# Patient Record
Sex: Male | Born: 1955 | Race: Black or African American | Hispanic: No | Marital: Married | Smoking: Former smoker
Health system: Southern US, Community
[De-identification: ages and names within clinical notes are randomized; demographics above are authoritative.]

## PROBLEM LIST (undated history)

## (undated) DIAGNOSIS — K5792 Diverticulitis of intestine, part unspecified, without perforation or abscess without bleeding: Secondary | ICD-10-CM

## (undated) DIAGNOSIS — I10 Essential (primary) hypertension: Secondary | ICD-10-CM

## (undated) HISTORY — DX: Diverticulitis of intestine, part unspecified, without perforation or abscess without bleeding: K57.92

## (undated) HISTORY — DX: Essential (primary) hypertension: I10

---

## 2008-03-10 LAB — HM COLONOSCOPY

## 2008-09-07 ENCOUNTER — Encounter: Admission: RE | Admit: 2008-09-07 | Discharge: 2008-09-07 | Payer: Self-pay | Admitting: Emergency Medicine

## 2011-05-08 ENCOUNTER — Encounter (INDEPENDENT_AMBULATORY_CARE_PROVIDER_SITE_OTHER): Payer: 59

## 2011-05-08 DIAGNOSIS — Z0000 Encounter for general adult medical examination without abnormal findings: Secondary | ICD-10-CM

## 2011-05-08 DIAGNOSIS — N401 Enlarged prostate with lower urinary tract symptoms: Secondary | ICD-10-CM

## 2011-05-08 DIAGNOSIS — I10 Essential (primary) hypertension: Secondary | ICD-10-CM

## 2012-03-23 ENCOUNTER — Ambulatory Visit (INDEPENDENT_AMBULATORY_CARE_PROVIDER_SITE_OTHER): Payer: 59 | Admitting: Internal Medicine

## 2012-03-23 ENCOUNTER — Telehealth: Payer: Self-pay | Admitting: *Deleted

## 2012-03-23 VITALS — BP 130/88 | HR 82 | Temp 98.3°F | Resp 20 | Ht 70.0 in | Wt 226.8 lb

## 2012-03-23 DIAGNOSIS — Z8619 Personal history of other infectious and parasitic diseases: Secondary | ICD-10-CM

## 2012-03-23 DIAGNOSIS — R109 Unspecified abdominal pain: Secondary | ICD-10-CM

## 2012-03-23 DIAGNOSIS — N400 Enlarged prostate without lower urinary tract symptoms: Secondary | ICD-10-CM

## 2012-03-23 DIAGNOSIS — I10 Essential (primary) hypertension: Secondary | ICD-10-CM | POA: Insufficient documentation

## 2012-03-23 DIAGNOSIS — Z7189 Other specified counseling: Secondary | ICD-10-CM

## 2012-03-23 DIAGNOSIS — Z79899 Other long term (current) drug therapy: Secondary | ICD-10-CM

## 2012-03-23 LAB — POCT URINALYSIS DIPSTICK
Blood, UA: NEGATIVE
Nitrite, UA: NEGATIVE
Protein, UA: NEGATIVE
Spec Grav, UA: 1.015
Urobilinogen, UA: 0.2
pH, UA: 6.5

## 2012-03-23 LAB — POCT CBC
Hemoglobin: 15.5 g/dL (ref 14.1–18.1)
Lymph, poc: 2.6 (ref 0.6–3.4)
MCH, POC: 28 pg (ref 27–31.2)
MCHC: 30.6 g/dL — AB (ref 31.8–35.4)
MCV: 91.6 fL (ref 80–97)
POC MID %: 7.2 %M (ref 0–12)
RBC: 5.54 M/uL (ref 4.69–6.13)
WBC: 5.5 10*3/uL (ref 4.6–10.2)

## 2012-03-23 LAB — POCT UA - MICROSCOPIC ONLY
Bacteria, U Microscopic: NEGATIVE
Casts, Ur, LPF, POC: NEGATIVE
Crystals, Ur, HPF, POC: NEGATIVE
Epithelial cells, urine per micros: NEGATIVE
Mucus, UA: NEGATIVE
Yeast, UA: NEGATIVE

## 2012-03-23 MED ORDER — VALSARTAN 160 MG PO TABS: 160.0000 mg | ORAL_TABLET | Freq: Every day | ORAL | Status: DC

## 2012-03-23 MED ORDER — AMLODIPINE BESYLATE 10 MG PO TABS: 10.0000 mg | ORAL_TABLET | Freq: Every day | ORAL | Status: DC

## 2012-03-23 MED ORDER — FINASTERIDE 1 MG PO TABS: 5.0000 mg | ORAL_TABLET | Freq: Every day | ORAL | Status: DC

## 2012-03-23 NOTE — Patient Instructions (Addendum)
DASH Diet The DASH diet stands for "Dietary Approaches to Stop Hypertension." It is a healthy eating plan that has been shown to reduce high blood pressure (hypertension) in as little as 14 days, while also possibly providing other significant health benefits. These other health benefits include reducing the risk of breast cancer after menopause and reducing the risk of type 2 diabetes, heart disease, colon cancer, and stroke. Health benefits also include weight loss and slowing kidney failure in patients with chronic kidney disease.  DIET GUIDELINES  Limit salt (sodium). Your diet should contain less than 1500 mg of sodium daily.  Limit refined or processed carbohydrates. Your diet should include mostly whole grains. Desserts and added sugars should be used sparingly.  Include small amounts of heart-healthy fats. These types of fats include nuts, oils, and tub margarine. Limit saturated and trans fats. These fats have been shown to be harmful in the body. CHOOSING FOODS  The following food groups are based on a 2000 calorie diet. See your Registered Dietitian for individual calorie needs. Grains and Grain Products (6 to 8 servings daily)  Eat More Often: Whole-wheat bread, brown rice, whole-grain or wheat pasta, quinoa, popcorn without added fat or salt (air popped).  Eat Less Often: White bread, white pasta, white rice, cornbread. Vegetables (4 to 5 servings daily)  Eat More Often: Fresh, frozen, and canned vegetables. Vegetables may be raw, steamed, roasted, or grilled with a minimal amount of fat.  Eat Less Often/Avoid: Creamed or fried vegetables. Vegetables in a cheese sauce. Fruit (4 to 5 servings daily)  Eat More Often: All fresh, canned (in natural juice), or frozen fruits. Dried fruits without added sugar. One hundred percent fruit juice ( cup [237 mL] daily).  Eat Less Often: Dried fruits with added sugar. Canned fruit in light or heavy syrup. [employer], Fish, and Poultry (2  servings or less daily. One serving is 3 to 4 oz [85-114 g]).  Eat More Often: Ninety percent or leaner ground beef, tenderloin, sirloin. Round cuts of beef, chicken breast, [place] breast. All fish. Grill, bake, or broil your meat. Nothing should be fried.  Eat Less Often/Avoid: Fatty cuts of meat, [place], or chicken leg, thigh, or wing. Fried cuts of meat or fish. Dairy (2 to 3 servings)  Eat More Often: Low-fat or fat-free milk, low-fat plain or light yogurt, reduced-fat or part-skim cheese.  Eat Less Often/Avoid: Milk (whole, 2%).Whole milk yogurt. Full-fat cheeses. Nuts, Seeds, and Legumes (4 to 5 servings per week)  Eat More Often: All without added salt.  Eat Less Often/Avoid: Salted nuts and seeds, canned beans with added salt. Fats and Sweets (limited)  Eat More Often: Vegetable oils, tub margarines without trans fats, sugar-free gelatin. Mayonnaise and salad dressings.  Eat Less Often/Avoid: Coconut oils, palm oils, butter, stick margarine, cream, half and half, cookies, candy, pie. FOR MORE INFORMATION The Dash Diet Eating Plan: www.dashdiet.org Document Released: 04/24/2011 Document Revised: 07/28/2011 Document Reviewed: 04/24/2011 [facility] Patient Information 2013 [place], [place]. Hepatitis C Hepatitis C is a viral infection of the liver. Infection may go undetected for months or years because symptoms may be absent or very mild. Chronic liver disease is the main danger of hepatitis C. This may lead to scarring of the liver (cirrhosis), liver failure, and liver cancer. CAUSES  Hepatitis C is caused by the hepatitis C virus (HCV). Formerly, hepatitis C infections were most commonly transmitted through blood transfusions. In the early 1990s, routine testing of donated blood for hepatitis C and exclusion  of blood that tests positive for HCV began. Now, HCV is most commonly transmitted from person to person through injection drug use, sharing needles, or sex with an infected  person. A caregiver may also get the infection from exposure to the blood of an infected patient by way of a cut or needle stick.  SYMPTOMS  Acute Phase Many cases of acute HCV infection are mild and cause few problems.Some people may not even realize they are sick.Symptoms in others may last a few weeks to several months and include:  Feeling very tired.  Loss of appetite.  Nausea.  Vomiting.  Abdominal pain.  Dark yellow urine.  Yellow skin and eyes (jaundice).  Itching of the skin. Chronic Phase  Between 50% to 85% of people who get HCV infection become "chronic carriers." They often have no symptoms, but the virus stays in their body.They may spread the virus to others and can get long-term liver disease.  Many people with chronic HCV infection remain healthy for many years. However, up to 1 in 5 chronically infected people may develop severe liver diseases including scarring of the liver (cirrhosis), liver failure, or liver cancer. DIAGNOSIS  Diagnosis of hepatitis C infection is made by testing blood for the presence of hepatitis C viral particles called RNA. Other tests may also be done to measure the status of current liver function, exclude other liver problems, or assess liver damage. TREATMENT  Treatment with many antiviral drugs is available and recommended for some patients with chronic HCV infection. Drug treatment is generally considered appropriate for patients who:  Are 40 years of age or older.  Have a positive test for HCV particles in the blood.  Have a liver tissue sample (biopsy) that shows chronic hepatitis and significant scarring (fibrosis).  Do not have signs of liver failure.  Have acceptable blood test results that confirm the wellness of other body organs.  Are willing to be treated and conform to treatment requirements.  Have no other circumstances that would prevent treatment from being recommended (contraindications). All people who are  offered and choose to receive drug treatment must understand that careful medical follow up for many months and even years is crucial in order to make successful care possible. The goal of drug treatment is to eliminate any evidence of HCV in the blood on a long-term basis. This is called a "sustained virologic response" or SVR. Achieving a SVR is associated with a decrease in the chance of life-threatening liver problems, need for a liver transplant, liver cancer rates, and liver-related complications. Successful treatment currently requires taking treatment drugs for at least 24 weeks and up to 72 weeks. An injected drug (interferon) given weekly and an oral antiviral medicine taken daily are usually prescribed. Side effects from these drugs are common and some may be very serious. Your response to treatment must be carefully monitored by both you and your caregiver throughout the entire treatment period. PREVENTION There is no vaccine for hepatitis C. The only way to prevent the disease is to reduce the risk of exposure to the virus.   Avoid sharing drug needles or personal items like toothbrushes, razors, and nail clippers with an infected person.  Healthcare workers need to avoid injuries and wear appropriate protective equipment such as gloves, gowns, and face masks when performing invasive medical or nursing procedures. HOME CARE INSTRUCTIONS  To avoid making your liver disease worse:  Strictly avoid drinking alcohol.  Carefully review all new prescriptions of medicines with your caregiver.  Ask your caregiver which drugs you should avoid. The following drugs are toxic to the liver, and your caregiver may tell you to avoid them:  Isoniazid.  Methyldopa.  Acetaminophen.  Anabolic steroids (muscle-building drugs).  Erythromycin.  Oral contraceptives (birth control pills).  Check with your caregiver to make sure medicine you are currently taking will not be harmful.  Periodic blood  tests may be required. Follow your caregiver's advice about when you should have blood tests.  Avoid a sexual relationship until advised otherwise by your caregiver.  Avoid activities that could expose other people to your blood. Examples include sharing a toothbrush, nail clippers, razors, and needles.  Bed rest is not necessary, but it may make you feel better. Recovery time is not related to the amount of rest you receive.  This infection is contagious. Follow your caregiver's instructions in order to avoid spread of the infection. SEEK IMMEDIATE MEDICAL CARE IF:  You have increasing fatigue or weakness.  You have an oral temperature above 102 F (38.9 C), not controlled by medicine.  You develop loss of appetite, nausea, or vomiting.  You develop jaundice.  You develop easy bruising or bleeding.  You develop any severe problems as a result of your treatment. MAKE SURE YOU:   Understand these instructions.  Will watch your condition.  Will get help right away if you are not doing well or get worse. Document Released: 05/02/2000 Document Revised: 07/28/2011 Document Reviewed: 09/04/2010 [facility] Patient Information 2013 [place], [place].

## 2012-03-23 NOTE — Progress Notes (Signed)
  Subjective:    Patient ID: Phillip Hicks, male    DOB: 07-19-1955, 56 y.o.   MRN: [id number]  HPI HTN controlled and needs rfs. BPH is mild Has slowly progressive left flank and left abdomen pain, waxes and wanes, occ severe. Spasms No blood seen, no fever, urinary, gi sxs. Past hx of hepatitis c being ruled out.  Review of Systems     Objective:   Physical Exam  Vitals reviewed. Constitutional: He is oriented to person, place, and time. He appears well-developed and well-nourished.  HENT:  Right Ear: External ear normal.  Left Ear: External ear normal.  Nose: Nose normal.  Eyes: EOM are normal.  Cardiovascular: Normal rate, regular rhythm and normal heart sounds.   Pulmonary/Chest: Effort normal and breath sounds normal.  Abdominal: Soft. Bowel sounds are normal. He exhibits distension. He exhibits no mass. There is tenderness. There is no rebound and no guarding.  Neurological: He is alert and oriented to person, place, and time. Coordination normal.  Skin: Skin is warm and dry.  Psychiatric: He has a normal mood and affect. His behavior is normal. Judgment and thought content normal.     Results for orders placed in visit on 03/23/12  POCT CBC      Component Value Range   WBC 5.5  4.6 - 10.2 K/uL   Lymph, poc 2.6  0.6 - 3.4   POC LYMPH PERCENT 46.7  10 - 50 %L   MID (cbc) 0.4  0 - 0.9   POC MID % 7.2  0 - 12 %M   POC Granulocyte 2.5  2 - 6.9   Granulocyte percent 46.1  37 - 80 %G   RBC 5.54  4.69 - 6.13 M/uL   Hemoglobin 15.5  14.1 - 18.1 g/dL   HCT, POC 14.7  82.9 - 53.7 %   MCV 91.6  80 - 97 fL   MCH, POC 28.0  27 - 31.2 pg   MCHC 30.6 (*) 31.8 - 35.4 g/dL   RDW, POC 56.2     Platelet Count, POC 404  142 - 424 K/uL   MPV 7.4  0 - 99.8 fL  POCT UA - MICROSCOPIC ONLY      Component Value Range   WBC, Ur, HPF, POC neg     RBC, urine, microscopic neg     Bacteria, U Microscopic neg     Mucus, UA neg     Epithelial cells, urine per micros neg     Crystals, Ur,  HPF, POC neg     Casts, Ur, LPF, POC neg     Yeast, UA neg    POCT URINALYSIS DIPSTICK      Component Value Range   Color, UA yellow     Clarity, UA clear     Glucose, UA neg     Bilirubin, UA neg     Ketones, UA neg     Spec Grav, UA 1.015     Blood, UA neg     pH, UA 6.5     Protein, UA neg     Urobilinogen, UA 0.2     Nitrite, UA neg     Leukocytes, UA Negative          Assessment & Plan:  RF meds 1 year Sched CPE in next 2 mos CT abd/pelvis for chronic left flank and abd pain

## 2012-03-24 LAB — COMPREHENSIVE METABOLIC PANEL
ALT: 24 U/L (ref 0–53)
AST: 20 U/L (ref 0–37)
Albumin: 4.7 g/dL (ref 3.5–5.2)
Alkaline Phosphatase: 55 U/L (ref 39–117)
BUN: 10 mg/dL (ref 6–23)
Potassium: 4 mEq/L (ref 3.5–5.3)
Sodium: 140 mEq/L (ref 135–145)

## 2012-03-24 LAB — PSA: PSA: 0.16 ng/mL (ref <=4.00)

## 2012-03-24 LAB — LIPID PANEL
LDL Cholesterol: 78 mg/dL (ref 0–99)
Triglycerides: 68 mg/dL (ref <150)
VLDL: 14 mg/dL (ref 0–40)

## 2012-03-25 ENCOUNTER — Telehealth: Payer: Self-pay

## 2012-03-25 DIAGNOSIS — R109 Unspecified abdominal pain: Secondary | ICD-10-CM

## 2012-03-25 DIAGNOSIS — N400 Enlarged prostate without lower urinary tract symptoms: Secondary | ICD-10-CM

## 2012-03-25 DIAGNOSIS — Z8619 Personal history of other infectious and parasitic diseases: Secondary | ICD-10-CM

## 2012-03-25 DIAGNOSIS — Z7189 Other specified counseling: Secondary | ICD-10-CM

## 2012-03-25 DIAGNOSIS — I10 Essential (primary) hypertension: Secondary | ICD-10-CM

## 2012-03-25 MED ORDER — FINASTERIDE 5 MG PO TABS: 5.0000 mg | ORAL_TABLET | Freq: Every day | ORAL | Status: DC

## 2012-03-25 NOTE — Telephone Encounter (Signed)
The Rx was sent through as 1mg  tablet, take 5 tabs daily, wanted to change to 5mg  tablets, one daily. Gave authorization for this, dosage same.

## 2012-03-25 NOTE — Telephone Encounter (Signed)
Phillip Hicks from [facility] [employer] [facility], calling to request rx auth on finasteride please contact Phillip Hicks @ [phone] ref# [id number]

## 2012-03-25 NOTE — Telephone Encounter (Signed)
Called left message for call back

## 2012-03-31 ENCOUNTER — Ambulatory Visit
Admission: RE | Admit: 2012-03-31 | Discharge: 2012-03-31 | Disposition: A | Discharge status: Home / Self Care | Payer: 59 | Source: Ambulatory Visit | Attending: Internal Medicine | Admitting: Internal Medicine

## 2012-03-31 DIAGNOSIS — R109 Unspecified abdominal pain: Secondary | ICD-10-CM

## 2012-03-31 MED ORDER — IOHEXOL 300 MG/ML  SOLN
100.0000 mL | Freq: Once | INTRAMUSCULAR | Status: AC | PRN
  Administered 2012-03-31: 100 mL via INTRAVENOUS

## 2012-04-05 ENCOUNTER — Telehealth: Payer: Self-pay | Admitting: *Deleted

## 2012-04-05 NOTE — Telephone Encounter (Signed)
Called patient at 80, I advised him to come to [facility] walk-in clinic 04/06/12 between 0900-1500 to see Dr Perrin Maltese to discuss imaging results.

## 2012-04-06 ENCOUNTER — Ambulatory Visit (INDEPENDENT_AMBULATORY_CARE_PROVIDER_SITE_OTHER): Payer: 59 | Admitting: Internal Medicine

## 2012-04-06 VITALS — BP 118/72 | HR 74 | Temp 97.9°F | Resp 16 | Ht 74.0 in | Wt 227.0 lb

## 2012-04-06 DIAGNOSIS — K5732 Diverticulitis of large intestine without perforation or abscess without bleeding: Secondary | ICD-10-CM

## 2012-04-06 DIAGNOSIS — K5792 Diverticulitis of intestine, part unspecified, without perforation or abscess without bleeding: Secondary | ICD-10-CM

## 2012-04-06 MED ORDER — AMOXICILLIN-POT CLAVULANATE 875-125 MG PO TABS: 1.0000 | ORAL_TABLET | Freq: Two times a day (BID) | ORAL | Status: DC

## 2012-04-06 NOTE — Progress Notes (Signed)
  Subjective:    Patient ID: Phillip Hicks, male    DOB: [birth year], 56 y.o.   MRN: [id number]  HPI Pain 90% resolved, had mild diverticulitis on ct abd. Much better. NO anorexia, fever, pain with movement   Review of Systems     Objective:   Physical Exam  Vitals reviewed. Constitutional: He is oriented to person, place, and time. He appears well-developed and well-nourished.  Cardiovascular: Normal rate.   Pulmonary/Chest: Effort normal and breath sounds normal.  Abdominal: Soft. Bowel sounds are normal. He exhibits no mass. There is no tenderness.  Neurological: He is alert and oriented to person, place, and time.  Psychiatric: He has a normal mood and affect.          Assessment & Plan:  Augmentin 875 bid

## 2012-04-06 NOTE — Patient Instructions (Signed)
Diverticulitis °A diverticulum is a small pouch or sac on the colon. Diverticulosis is the presence of these diverticula on the colon. Diverticulitis is the irritation (inflammation) or infection of diverticula. °CAUSES  °The colon and its diverticula contain bacteria. If food particles block the tiny opening to a diverticulum, the bacteria inside can grow and cause an increase in pressure. This leads to infection and inflammation and is called diverticulitis. °SYMPTOMS  °· Abdominal pain and tenderness. Usually, the pain is located on the left side of your abdomen. However, it could be located elsewhere. °· Fever. °· Bloating. °· Feeling sick to your stomach (nausea). °· Throwing up (vomiting). °· Abnormal stools. °DIAGNOSIS  °Your caregiver will take a history and perform a physical exam. Since many things can cause abdominal pain, other tests may be necessary. Tests may include: °· Blood tests. °· Urine tests. °· X-ray of the abdomen. °· CT scan of the abdomen. °Sometimes, surgery is needed to determine if diverticulitis or other conditions are causing your symptoms. °TREATMENT  °Most of the time, you can be treated without surgery. Treatment includes: °· Resting the bowels by only having liquids for a few days. As you improve, you will need to eat a low-fiber diet. °· Intravenous (IV) fluids if you are losing body fluids (dehydrated). °· Antibiotic medicines that treat infections may be given. °· Pain and nausea medicine, if needed. °· Surgery if the inflamed diverticulum has burst. °HOME CARE INSTRUCTIONS  °· Try a clear liquid diet (broth, tea, or water for as long as directed by your caregiver). You may then gradually begin a low-fiber diet as tolerated. A low-fiber diet is a diet with less than 10 grams of fiber. Choose the foods below to reduce fiber in the diet: °· White breads, cereals, rice, and pasta. °· Cooked fruits and vegetables or soft fresh fruits and vegetables without the skin. °· Ground or  well-cooked tender beef, ham, veal, lamb, pork, or poultry. °· Eggs and seafood. °· After your diverticulitis symptoms have improved, your caregiver may put you on a high-fiber diet. A high-fiber diet includes 14 grams of fiber for every 1000 calories consumed. For a standard 2000 calorie diet, you would need 28 grams of fiber. Follow these diet guidelines to help you increase the fiber in your diet. It is important to slowly increase the amount fiber in your diet to avoid gas, constipation, and bloating. °· Choose whole-grain breads, cereals, pasta, and brown rice. °· Choose fresh fruits and vegetables with the skin on. Do not overcook vegetables because the more vegetables are cooked, the more fiber is lost. °· Choose more nuts, seeds, legumes, dried peas, beans, and lentils. °· Look for food products that have greater than 3 grams of fiber per serving on the Nutrition Facts label. °· Take all medicine as directed by your caregiver. °· If your caregiver has given you a follow-up appointment, it is very important that you go. Not going could result in lasting (chronic) or permanent injury, pain, and disability. If there is any problem keeping the appointment, call to reschedule. °SEEK MEDICAL CARE IF:  °· Your pain does not improve. °· You have a hard time advancing your diet beyond clear liquids. °· Your bowel movements do not return to normal. °SEEK IMMEDIATE MEDICAL CARE IF:  °· Your pain becomes worse. °· You have an oral temperature above 102° F (38.9° C), not controlled by medicine. °· You have repeated vomiting. °· You have bloody or black, tarry stools. °· Symptoms   that brought you to your caregiver become worse or are not getting better. °MAKE SURE YOU:  °· Understand these instructions. °· Will watch your condition. °· Will get help right away if you are not doing well or get worse. °Document Released: 02/12/2005 Document Revised: 07/28/2011 Document Reviewed: 06/10/2010 °[employer]® Patient Information  ©2013 [employer]. ° °

## 2012-05-10 ENCOUNTER — Ambulatory Visit (INDEPENDENT_AMBULATORY_CARE_PROVIDER_SITE_OTHER): Payer: 59 | Admitting: Family Medicine

## 2012-05-10 VITALS — BP 124/76 | HR 75 | Temp 98.3°F | Resp 16 | Ht 70.0 in | Wt 229.6 lb

## 2012-05-10 DIAGNOSIS — Z0000 Encounter for general adult medical examination without abnormal findings: Secondary | ICD-10-CM

## 2012-05-10 DIAGNOSIS — K5792 Diverticulitis of intestine, part unspecified, without perforation or abscess without bleeding: Secondary | ICD-10-CM

## 2012-05-10 NOTE — Progress Notes (Signed)
[facility] [address] [place] [address] [phone]  Date:  05/10/2012   Name:  Phillip Hicks   DOB:  09-19-1955   MRN:  [id number]  PCP:  No primary provider on file.    Chief Complaint: Annual Exam   History of Present Illness:  Phillip Hicks is a 56 y.o. very pleasant male patient who presents with the following:  History of diverticulitis last month- he is now better. He takes medication for HTN and BPH.   He was diagnosed with Hep C in the past several years ago. However, it seems this was a false positive and he does not really have Hep C.   He has labs done in November- he is not fasting today as no further labs are needed.   His flu shot is already done.   His last colonoscopy was about 5 years ago.    He thinks he has had a tetanus shot within 10 years, but this was done elsewhere.    Patient Active Problem List  Diagnosis  . HTN (hypertension)  . History of hepatitis C    Past Medical History  Diagnosis Date  . Hypertension   . Diverticulitis     No past surgical history on file.  History  Substance Use Topics  . Smoking status: Former Smoker    Quit date: 03/23/1997  . Smokeless tobacco: Not on file  . Alcohol Use: 2.5 oz/week    5 drink(s) per week    Family History  Problem Relation Age of Onset  . Asthma Mother   . Cancer Mother     No Known Allergies  Medication list has been reviewed and updated.  Current Outpatient Prescriptions on File Prior to Visit  Medication Sig Dispense Refill  . amLODipine (NORVASC) 10 MG tablet Take 1 tablet (10 mg total) by mouth daily.  90 tablet  3  . finasteride (PROSCAR) 5 MG tablet Take 1 tablet (5 mg total) by mouth daily.  90 tablet  2  . valsartan (DIOVAN) 160 MG tablet Take 1 tablet (160 mg total) by mouth daily.  90 tablet  3  . amoxicillin-clavulanate (AUGMENTIN) 875-125 MG per tablet Take 1 tablet by mouth 2 (two) times daily.  20 tablet  0    Review of Systems:  As  per HPI- otherwise negative.   Physical Examination: Filed Vitals:   05/10/12 0858  BP: 124/76  Pulse: 75  Temp: 98.3 F (36.8 C)  Resp: 16   Filed Vitals:   05/10/12 0858  Height: 5\' 10"  (1.778 m)  Weight: 229 lb 9.6 oz (104.146 kg)   Body mass index is 32.94 kg/(m^2). Ideal Body Weight: Weight in (lb) to have BMI = 25: 173.9   GEN: WDWN, NAD, Non-toxic, A & O x 3, overweight HEENT: Atraumatic, Normocephalic. Neck supple. No masses, No LAD. Bilateral TM wnl, oropharynx normal.  PEERL,EOMI.   Ears and Nose: No external deformity. CV: RRR, No M/G/R. No JVD. No thrill. No extra heart sounds. PULM: CTA B, no wheezes, crackles, rhonchi. No retractions. No resp. distress. No accessory muscle use. ABD: S, NT, ND, +BS. No rebound. No HSM. EXTR: No c/c/e NEURO Normal gait.  PSYCH: Normally interactive. Conversant. Not depressed or anxious appearing.  Calm demeanor.  Gu: normal genitals, normal DRE, no prostate masses  Assessment and Plan: 1. Diverticulitis   2. Physical exam, annual    PE for age performed today.  Labs and preventative services UTD.  Will  plan further follow- up pending labs. Per his report he does not actually have Hep C.  He will look into the date of his last tetanus shot.  If he cannot confirm this we can do a Tdap at his next visit    Abbe Amsterdam, MD

## 2012-05-10 NOTE — Patient Instructions (Addendum)
Good to see you today.  Your labs from last month looked fine.    Happy birthday!

## 2013-01-03 ENCOUNTER — Ambulatory Visit: Payer: 59 | Admitting: Family Medicine

## 2013-01-03 ENCOUNTER — Ambulatory Visit: Payer: 59

## 2013-01-03 VITALS — BP 132/88 | HR 76 | Temp 98.1°F | Resp 18 | Ht 69.5 in | Wt 228.6 lb

## 2013-01-03 DIAGNOSIS — R109 Unspecified abdominal pain: Secondary | ICD-10-CM

## 2013-01-03 DIAGNOSIS — M545 Low back pain, unspecified: Secondary | ICD-10-CM

## 2013-01-03 LAB — COMPREHENSIVE METABOLIC PANEL WITH GFR
AST: 22 U/L (ref 0–37)
Alkaline Phosphatase: 54 U/L (ref 39–117)
BUN: 7 mg/dL (ref 6–23)
Creat: 0.74 mg/dL (ref 0.50–1.35)
Glucose, Bld: 92 mg/dL (ref 70–99)
Potassium: 4.1 meq/L (ref 3.5–5.3)
Total Bilirubin: 0.9 mg/dL (ref 0.3–1.2)

## 2013-01-03 LAB — POCT UA - MICROSCOPIC ONLY
Bacteria, U Microscopic: NEGATIVE
Casts, Ur, LPF, POC: NEGATIVE
Crystals, Ur, HPF, POC: NEGATIVE
Epithelial cells, urine per micros: NEGATIVE
Mucus, UA: NEGATIVE
RBC, urine, microscopic: NEGATIVE
WBC, Ur, HPF, POC: NEGATIVE
Yeast, UA: NEGATIVE

## 2013-01-03 LAB — POCT URINALYSIS DIPSTICK
Bilirubin, UA: NEGATIVE
Blood, UA: NEGATIVE
Glucose, UA: NEGATIVE
Ketones, UA: NEGATIVE
Leukocytes, UA: NEGATIVE
Nitrite, UA: NEGATIVE
Protein, UA: NEGATIVE
Spec Grav, UA: 1.015
Urobilinogen, UA: 0.2
pH, UA: 7

## 2013-01-03 LAB — POCT CBC
Granulocyte percent: 45.6 %G (ref 37–80)
HCT, POC: 46.2 % (ref 43.5–53.7)
Hemoglobin: 14.8 g/dL (ref 14.1–18.1)
Lymph, poc: 2.3 (ref 0.6–3.4)
MCH, POC: 29.3 pg (ref 27–31.2)
MCHC: 32 g/dL (ref 31.8–35.4)
MCV: 91.4 fL (ref 80–97)
MID (cbc): 0.4 (ref 0–0.9)
MPV: 7.6 fL (ref 0–99.8)
POC Granulocyte: 2.2 (ref 2–6.9)
POC LYMPH PERCENT: 47.2 % (ref 10–50)
POC MID %: 7.2 %M (ref 0–12)
Platelet Count, POC: 321 10*3/uL (ref 142–424)
RBC: 5.05 M/uL (ref 4.69–6.13)
RDW, POC: 14.4 %
WBC: 4.9 10*3/uL (ref 4.6–10.2)

## 2013-01-03 LAB — COMPREHENSIVE METABOLIC PANEL
ALT: 28 U/L (ref 0–53)
Albumin: 4.6 g/dL (ref 3.5–5.2)
CO2: 25 mEq/L (ref 19–32)
Calcium: 9.6 mg/dL (ref 8.4–10.5)
Chloride: 106 mEq/L (ref 96–112)
Sodium: 139 mEq/L (ref 135–145)
Total Protein: 7 g/dL (ref 6.0–8.3)

## 2013-01-03 MED ORDER — CYCLOBENZAPRINE HCL 5 MG PO TABS: 5.0000 mg | ORAL_TABLET | Freq: Every evening | ORAL | Status: DC | PRN

## 2013-01-03 MED ORDER — MELOXICAM 7.5 MG PO TABS: 7.5000 mg | ORAL_TABLET | Freq: Every day | ORAL | Status: DC

## 2013-01-03 NOTE — Patient Instructions (Signed)
Back Exercises These exercises may help you when beginning to rehabilitate your injury. Your symptoms may resolve with or without further involvement from your physician, physical therapist or athletic trainer. While completing these exercises, remember:   Restoring tissue flexibility helps normal motion to return to the joints. This allows healthier, less painful movement and activity.  An effective stretch should be held for at least 30 seconds.  A stretch should never be painful. You should only feel a gentle lengthening or release in the stretched tissue. STRETCH  Extension, Prone on Elbows   Lie on your stomach on the floor, a bed will be too soft. Place your palms about shoulder width apart and at the height of your head.  Place your elbows under your shoulders. If this is too painful, stack pillows under your chest.  Allow your body to relax so that your hips drop lower and make contact more completely with the floor.  Hold this position for __________ seconds.  Slowly return to lying flat on the floor. Repeat __________ times. Complete this exercise __________ times per day.  RANGE OF MOTION  Extension, Prone Press Ups   Lie on your stomach on the floor, a bed will be too soft. Place your palms about shoulder width apart and at the height of your head.  Keeping your back as relaxed as possible, slowly straighten your elbows while keeping your hips on the floor. You may adjust the placement of your hands to maximize your comfort. As you gain motion, your hands will come more underneath your shoulders.  Hold this position __________ seconds.  Slowly return to lying flat on the floor. Repeat __________ times. Complete this exercise __________ times per day.  RANGE OF MOTION- Quadruped, Neutral Spine   Assume a hands and knees position on a firm surface. Keep your hands under your shoulders and your knees under your hips. You may place padding under your knees for comfort.  Drop  your head and point your tail bone toward the ground below you. This will round out your low back like an angry cat. Hold this position for __________ seconds.  Slowly lift your head and release your tail bone so that your back sags into a large arch, like an old horse.  Hold this position for __________ seconds.  Repeat this until you feel limber in your low back.  Now, find your "sweet spot." This will be the most comfortable position somewhere between the two previous positions. This is your neutral spine. Once you have found this position, tense your stomach muscles to support your low back.  Hold this position for __________ seconds. Repeat __________ times. Complete this exercise __________ times per day.  STRETCH  Flexion, Single Knee to Chest   Lie on a firm bed or floor with both legs extended in front of you.  Keeping one leg in contact with the floor, bring your opposite knee to your chest. Hold your leg in place by either grabbing behind your thigh or at your knee.  Pull until you feel a gentle stretch in your low back. Hold __________ seconds.  Slowly release your grasp and repeat the exercise with the opposite side. Repeat __________ times. Complete this exercise __________ times per day.  STRETCH - Hamstrings, Standing  Stand or sit and extend your right / left leg, placing your foot on a chair or foot stool  Keeping a slight arch in your low back and your hips straight forward.  Lead with your chest and   lean forward at the waist until you feel a gentle stretch in the back of your right / left knee or thigh. (When done correctly, this exercise requires leaning only a small distance.)  Hold this position for __________ seconds. Repeat __________ times. Complete this stretch __________ times per day. STRENGTHENING  Deep Abdominals, Pelvic Tilt   Lie on a firm bed or floor. Keeping your legs in front of you, bend your knees so they are both pointed toward the ceiling and  your feet are flat on the floor.  Tense your lower abdominal muscles to press your low back into the floor. This motion will rotate your pelvis so that your tail bone is scooping upwards rather than pointing at your feet or into the floor.  With a gentle tension and even breathing, hold this position for __________ seconds. Repeat __________ times. Complete this exercise __________ times per day.  STRENGTHENING  Abdominals, Crunches   Lie on a firm bed or floor. Keeping your legs in front of you, bend your knees so they are both pointed toward the ceiling and your feet are flat on the floor. Cross your arms over your chest.  Slightly tip your chin down without bending your neck.  Tense your abdominals and slowly lift your trunk high enough to just clear your shoulder blades. Lifting higher can put excessive stress on the low back and does not further strengthen your abdominal muscles.  Control your return to the starting position. Repeat __________ times. Complete this exercise __________ times per day.  STRENGTHENING  Quadruped, Opposite UE/Charizma Gardiner Lift   Assume a hands and knees position on a firm surface. Keep your hands under your shoulders and your knees under your hips. You may place padding under your knees for comfort.  Find your neutral spine and gently tense your abdominal muscles so that you can maintain this position. Your shoulders and hips should form a rectangle that is parallel with the floor and is not twisted.  Keeping your trunk steady, lift your right hand no higher than your shoulder and then your left leg no higher than your hip. Make sure you are not holding your breath. Hold this position __________ seconds.  Continuing to keep your abdominal muscles tense and your back steady, slowly return to your starting position. Repeat with the opposite arm and leg. Repeat __________ times. Complete this exercise __________ times per day. Document Released: 05/23/2005 Document  Revised: 07/28/2011 Document Reviewed: 08/17/2008 [employer] Patient Information 2014 [employer].  

## 2013-01-03 NOTE — Progress Notes (Signed)
[facility]:  Office Visit  Chief Complaint:  Chief Complaint  Patient presents with  . Back Pain    follow up     HPI: Phillip Hicks is a 57 y.o. male who complains of  3 weeks ago started having left flank pain and small of lower back, real sharp pain initially and will take him down to his knees, then it becomes stiff. The less mobile he is the more stiff he gets.  Denies numbness, tingling or weakness.  Has had msk strain playing raquetball from time to time since last several years.  He denies problems with urination--he has had no urinary changes for him since he does have BPH  He deneis fevers, chills, n/v. He felt warm in his back though.  No blood in urine or stool.  He feels better after a good nights rest.  He states that he does not have numbness or tingling but there is some "throbbing" but not really weakness in bilateral legs. He injured his back playing raquetball 25 years ago He has a history of diverticular disease on CT abd  Past Medical History  Diagnosis Date  . Hypertension   . Diverticulitis    No past surgical history on file. History   Social History  . Marital Status: Married    Spouse Name: N/A    Number of Children: N/A  . Years of Education: N/A   Social History Main Topics  . Smoking status: Former Smoker    Quit date: 03/23/1997  . Smokeless tobacco: None  . Alcohol Use: 2.5 oz/week    5 drink(s) per week  . Drug Use: No  . Sexual Activity: None   Other Topics Concern  . None   Social History Narrative  . None   Family History  Problem Relation Age of Onset  . Asthma Mother   . Cancer Mother   . Seizures Brother    No Known Allergies Prior to Admission medications   Medication Sig Start Date End Date Taking? Authorizing Provider  amLODipine (NORVASC) 10 MG tablet Take 1 tablet (10 mg total) by mouth daily. 03/23/12  Yes Jonita Albee, MD  finasteride (PROSCAR) 5 MG tablet Take 1 tablet (5 mg total) by mouth  daily. 03/25/12  Yes Jonita Albee, MD  valsartan (DIOVAN) 160 MG tablet Take 1 tablet (160 mg total) by mouth daily. 03/23/12  Yes Jonita Albee, MD  amoxicillin-clavulanate (AUGMENTIN) 875-125 MG per tablet Take 1 tablet by mouth 2 (two) times daily. 04/06/12   Jonita Albee, MD     ROS: The patient denies fevers, chills, night sweats, unintentional weight loss, chest pain, palpitations, wheezing, dyspnea on exertion, nausea, vomiting, abdominal pain, dysuria, hematuria, melena, numbness, weakness, or tingling.   All other systems have been reviewed and were otherwise negative with the exception of those mentioned in the HPI and as above.    PHYSICAL EXAM: Filed Vitals:   01/03/13 0943  BP: 132/88  Pulse: 76  Temp: 98.1 F (36.7 C)  Resp: 18   Filed Vitals:   01/03/13 0943  Height: 5' 9.5" (1.765 m)  Weight: 228 lb 9.6 oz (103.692 kg)   Body mass index is 33.29 kg/(m^2).  General: Alert, no acute distress HEENT:  Normocephalic, atraumatic, oropharynx patent. EOMI, PERRLA Cardiovascular:  Regular rate and rhythm, no rubs murmurs or gallops.  No Carotid bruits, radial pulse intact. No pedal edema.  Respiratory: Clear to auscultation bilaterally.  No wheezes, rales, or  rhonchi.  No cyanosis, no use of accessory musculature GI: No organomegaly, abdomen is soft and non-tender, positive bowel sounds.  No masses. Skin: No rashes. Neurologic: Facial musculature symmetric. Psychiatric: Patient is appropriate throughout our interaction. Lymphatic: No cervical lymphadenopathy Musculoskeletal: Gait intact. Decrease ROM in flexion, extension, lateral rotation Paras msk tenderness on left more than right at low L spine Nontender at SI jt No saddle anesthesia 5/5 strength, 2/2 DTRs No loss of sensation   LABS: Results for orders placed in visit on 01/03/13  POCT CBC      Result Value Range   WBC 4.9  4.6 - 10.2 K/uL   Lymph, poc 2.3  0.6 - 3.4   POC LYMPH PERCENT 47.2  10 - 50 %L    MID (cbc) 0.4  0 - 0.9   POC MID % 7.2  0 - 12 %M   POC Granulocyte 2.2  2 - 6.9   Granulocyte percent 45.6  37 - 80 %G   RBC 5.05  4.69 - 6.13 M/uL   Hemoglobin 14.8  14.1 - 18.1 g/dL   HCT, POC 16.1  09.6 - 53.7 %   MCV 91.4  80 - 97 fL   MCH, POC 29.3  27 - 31.2 pg   MCHC 32.0  31.8 - 35.4 g/dL   RDW, POC 04.5     Platelet Count, POC 321  142 - 424 K/uL   MPV 7.6  0 - 99.8 fL  POCT UA - MICROSCOPIC ONLY      Result Value Range   WBC, Ur, HPF, POC neg     RBC, urine, microscopic neg     Bacteria, U Microscopic neg     Mucus, UA neg     Epithelial cells, urine per micros neg     Crystals, Ur, HPF, POC neg     Casts, Ur, LPF, POC neg     Yeast, UA neg    POCT URINALYSIS DIPSTICK      Result Value Range   Color, UA yellow     Clarity, UA clear     Glucose, UA neg     Bilirubin, UA neg     Ketones, UA neg     Spec Grav, UA 1.015     Blood, UA neg     pH, UA 7.0     Protein, UA neg     Urobilinogen, UA 0.2     Nitrite, UA neg     Leukocytes, UA Negative       EKG/XRAY:   Primary read interpreted by Dr. Conley Rolls at [facility]. 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  . Back pain, lumbosacral Yes  . Flank pain   . Abdominal  pain, other specified site     Most likely related to DJD of L5-S1 without any neuro deficits Will try trial of mobic and flexeril If he needs ortho referral for possible ESI if sxs worsen and nothing else works ie ROM exercises, PT F/u prn Gross sideeffects, risk and benefits, and alternatives of medications d/w patient. Patient is aware that all medications have potential sideeffects and we are unable to predict every sideeffect or drug-drug interaction that may occur.  Alazne Quant PHUONG, DO 01/03/2013 11:24 AM

## 2013-02-03 ENCOUNTER — Encounter: Payer: Self-pay | Admitting: Family Medicine

## 2013-03-30 ENCOUNTER — Other Ambulatory Visit: Payer: Self-pay | Admitting: Internal Medicine

## 2013-05-04 ENCOUNTER — Encounter: Payer: Self-pay | Admitting: Family Medicine

## 2013-05-04 ENCOUNTER — Ambulatory Visit (INDEPENDENT_AMBULATORY_CARE_PROVIDER_SITE_OTHER): Payer: 59 | Admitting: Family Medicine

## 2013-05-04 VITALS — BP 122/82 | HR 82 | Temp 97.7°F | Resp 16 | Ht 69.5 in | Wt 221.8 lb

## 2013-05-04 DIAGNOSIS — R109 Unspecified abdominal pain: Secondary | ICD-10-CM

## 2013-05-04 DIAGNOSIS — N400 Enlarged prostate without lower urinary tract symptoms: Secondary | ICD-10-CM

## 2013-05-04 DIAGNOSIS — Z7189 Other specified counseling: Secondary | ICD-10-CM

## 2013-05-04 DIAGNOSIS — Z8619 Personal history of other infectious and parasitic diseases: Secondary | ICD-10-CM

## 2013-05-04 DIAGNOSIS — I10 Essential (primary) hypertension: Secondary | ICD-10-CM

## 2013-05-04 DIAGNOSIS — Z23 Encounter for immunization: Secondary | ICD-10-CM

## 2013-05-04 DIAGNOSIS — Z0000 Encounter for general adult medical examination without abnormal findings: Secondary | ICD-10-CM

## 2013-05-04 LAB — COMPREHENSIVE METABOLIC PANEL
AST: 21 U/L (ref 0–37)
Albumin: 4.5 g/dL (ref 3.5–5.2)
BUN: 8 mg/dL (ref 6–23)
Calcium: 9.7 mg/dL (ref 8.4–10.5)
Chloride: 107 mEq/L (ref 96–112)
Potassium: 4.5 mEq/L (ref 3.5–5.3)
Total Protein: 7.3 g/dL (ref 6.0–8.3)

## 2013-05-04 LAB — PSA: PSA: 0.15 ng/mL (ref <=4.00)

## 2013-05-04 LAB — LIPID PANEL: HDL: 60 mg/dL (ref >39)

## 2013-05-04 LAB — POCT UA - MICROSCOPIC ONLY
Bacteria, U Microscopic: NEGATIVE
Crystals, Ur, HPF, POC: NEGATIVE
RBC, urine, microscopic: NEGATIVE

## 2013-05-04 LAB — POCT URINALYSIS DIPSTICK
Bilirubin, UA: NEGATIVE
Glucose, UA: NEGATIVE
Ketones, UA: NEGATIVE
Spec Grav, UA: 1.02

## 2013-05-04 LAB — HEPATITIS C ANTIBODY: HCV Ab: NEGATIVE

## 2013-05-04 LAB — POCT CBC
Granulocyte percent: 45.8 %G (ref 37–80)
HCT, POC: 47.5 % (ref 43.5–53.7)
MCV: 92.7 fL (ref 80–97)
MID (cbc): 0.3 (ref 0–0.9)
Platelet Count, POC: 329 10*3/uL (ref 142–424)
RBC: 5.12 M/uL (ref 4.69–6.13)

## 2013-05-04 MED ORDER — FINASTERIDE 5 MG PO TABS: 5.0000 mg | ORAL_TABLET | Freq: Every day | ORAL | Status: DC

## 2013-05-04 MED ORDER — AMLODIPINE BESYLATE 10 MG PO TABS: 10.0000 mg | ORAL_TABLET | Freq: Every day | ORAL | Status: DC

## 2013-05-04 MED ORDER — VALSARTAN 160 MG PO TABS: 160.0000 mg | ORAL_TABLET | Freq: Every day | ORAL | Status: DC

## 2013-05-04 NOTE — Progress Notes (Signed)
[facility] [address] [place] [address] [phone]  Date:  05/04/2013   Name:  Phillip Hicks   DOB:  [birth year]   MRN:  [id number]  PCP:  No primary provider on file.    Chief Complaint: CPE and Medication Refill   History of Present Illness:  Phillip Hicks is a 57 y.o. very pleasant male patient who presents with the following:  He is here today for a PE. He is fasting today except for black coffee.   History of HTN, diverticulitis.  His diverticulitis has not bothered him recently.    He has had a colonoscopy about 5 years ago- this was done in [place] which is his home.  He works in [employer] date of last tetanus. He would be willing to get this today.    There is some question of a history Hep C; it appears he had transaminitis several years ago and a positive Hep C ab- however his titer was negative and his transaminitis resolved with medication change.  Per a note from GI about 13 years ago, he was thought not to have hep C after all although he was supposed to follow- up again.    He used to smoke- quit several years ago  Patient Active Problem List   Diagnosis Date Noted  . Diverticulitis 05/10/2012  . HTN (hypertension) 03/23/2012  . History of hepatitis C 03/23/2012    Past Medical History  Diagnosis Date  . Hypertension   . Diverticulitis     No past surgical history on file.  History  Substance Use Topics  . Smoking status: Former Smoker    Quit date: 03/23/1997  . Smokeless tobacco: Not on file  . Alcohol Use: 2.5 oz/week    5 drink(s) per week    Family History  Problem Relation Age of Onset  . Asthma Mother   . Cancer Mother   . Seizures Brother   . Hypertension Sister   . Cancer Sister     No Known Allergies  Medication list has been reviewed and updated.  Current Outpatient Prescriptions on File Prior to Visit  Medication Sig Dispense Refill  . amLODipine (NORVASC) 10 MG tablet Take 1 tablet (10  mg total) by mouth daily.  90 tablet  3  . finasteride (PROSCAR) 5 MG tablet Take 1 tablet (5 mg total) by mouth daily.  90 tablet  2  . valsartan (DIOVAN) 160 MG tablet Take 1 tablet (160 mg total) by mouth daily.  90 tablet  3  . cyclobenzaprine (FLEXERIL) 5 MG tablet Take 1 tablet (5 mg total) by mouth at bedtime as needed.  30 tablet  0  . meloxicam (MOBIC) 7.5 MG tablet Take 1 tablet (7.5 mg total) by mouth daily. May take up to 2  Tabs as needed, max of 2 a day. Take with food, no other NSAIDs  30 tablet  1   No current facility-administered medications on file prior to visit.    Review of Systems:  As per HPI- otherwise negative.   Physical Examination: Filed Vitals:   05/04/13 0809  BP: 122/82  Pulse: 82  Temp: 97.7 F (36.5 C)  Resp: 16   Filed Vitals:   05/04/13 0809  Height: 5' 9.5" (1.765 m)  Weight: 221 lb 12.8 oz (100.608 kg)   Body mass index is 32.3 kg/(m^2). Ideal Body Weight: Weight in (lb) to have BMI = 25: 171.4  GEN: WDWN, NAD, Non-toxic, A &  O x 3, overweight,  Looks well HEENT: Atraumatic, Normocephalic. Neck supple. No masses, No LAD.  Bilateral TM wnl, oropharynx normal.  PEERL,EOMI.   Ears and Nose: No external deformity. CV: RRR, No M/G/R. No JVD. No thrill. No extra heart sounds. PULM: CTA B, no wheezes, crackles, rhonchi. No retractions. No resp. distress. No accessory muscle use. ABD: S, NT, ND, +BS. No rebound. No HSM. EXTR: No c/c/e NEURO Normal gait.  PSYCH: Normally interactive. Conversant. Not depressed or anxious appearing.  Calm demeanor.  GU: normal exam, no inguinal hernia, no testicular masses, no penile discharge or lesions.  Normal prostate  Assessment and Plan: Physical exam - Plan: POCT CBC, Comprehensive metabolic panel, Lipid panel, PSA, Tdap vaccine greater than or equal to 7yo IM, POCT UA - Microscopic Only, POCT urinalysis dipstick  Abdominal pain  HTN (hypertension) - Plan: amLODipine (NORVASC) 10 MG tablet, valsartan  (DIOVAN) 160 MG tablet  Encounter for medication review and counseling - Plan: amLODipine (NORVASC) 10 MG tablet, valsartan (DIOVAN) 160 MG tablet  History of hepatitis C - Plan: Hepatitis C antibody, CANCELED: Hepatitis C RNA quantitative  BPH (benign prostatic hyperplasia) - Plan: PSA, finasteride (PROSCAR) 5 MG tablet  Bp is controlled, refill meds.   Update tdap today.  Colonoscopy is UTD History of diverticulitis, currently quiet.   Possible history of hep C: will do repeat antibody and quant today and try and set this issue to rest.  However when ab came back negative canceled quant.    Meds ordered this encounter  Medications  . amLODipine (NORVASC) 10 MG tablet    Sig: Take 1 tablet (10 mg total) by mouth daily.    Dispense:  90 tablet    Refill:  3  . valsartan (DIOVAN) 160 MG tablet    Sig: Take 1 tablet (160 mg total) by mouth daily.    Dispense:  90 tablet    Refill:  3  . finasteride (PROSCAR) 5 MG tablet    Sig: Take 1 tablet (5 mg total) by mouth daily.    Dispense:  90 tablet    Refill:  3     Signed Abbe Amsterdam, MD

## 2013-05-04 NOTE — Patient Instructions (Signed)
I will be in touch with your labs Your tetanus shot is good for the next 8- 10 years.   Your BP looks great today.   Happy holidays!!

## 2014-05-09 ENCOUNTER — Ambulatory Visit (INDEPENDENT_AMBULATORY_CARE_PROVIDER_SITE_OTHER): Payer: 59 | Admitting: Physician Assistant

## 2014-05-09 VITALS — BP 114/76 | HR 80 | Temp 98.3°F | Resp 16 | Ht 71.0 in | Wt 222.0 lb

## 2014-05-09 DIAGNOSIS — Z131 Encounter for screening for diabetes mellitus: Secondary | ICD-10-CM

## 2014-05-09 DIAGNOSIS — Z125 Encounter for screening for malignant neoplasm of prostate: Secondary | ICD-10-CM

## 2014-05-09 DIAGNOSIS — Z13220 Encounter for screening for lipoid disorders: Secondary | ICD-10-CM

## 2014-05-09 DIAGNOSIS — I10 Essential (primary) hypertension: Secondary | ICD-10-CM

## 2014-05-09 DIAGNOSIS — Z130 Encounter for screening for diseases of the blood and blood-forming organs and certain disorders involving the immune mechanism: Secondary | ICD-10-CM

## 2014-05-09 DIAGNOSIS — Z0000 Encounter for general adult medical examination without abnormal findings: Secondary | ICD-10-CM

## 2014-05-09 DIAGNOSIS — Z7189 Other specified counseling: Secondary | ICD-10-CM

## 2014-05-09 LAB — COMPREHENSIVE METABOLIC PANEL
ALK PHOS: 59 U/L (ref 39–117)
ALT: 25 U/L (ref 0–53)
AST: 20 U/L (ref 0–37)
Albumin: 4.7 g/dL (ref 3.5–5.2)
BILIRUBIN TOTAL: 0.7 mg/dL (ref 0.2–1.2)
BUN: 11 mg/dL (ref 6–23)
CALCIUM: 9.5 mg/dL (ref 8.4–10.5)
CHLORIDE: 106 meq/L (ref 96–112)
CO2: 25 mEq/L (ref 19–32)
CREATININE: 0.84 mg/dL (ref 0.50–1.35)
Glucose, Bld: 96 mg/dL (ref 70–99)
Potassium: 4.4 mEq/L (ref 3.5–5.3)
Sodium: 141 mEq/L (ref 135–145)
Total Protein: 7.2 g/dL (ref 6.0–8.3)

## 2014-05-09 LAB — CBC
HEMATOCRIT: 44.8 % (ref 39.0–52.0)
HEMOGLOBIN: 15.1 g/dL (ref 13.0–17.0)
MCH: 28.9 pg (ref 26.0–34.0)
MCHC: 33.7 g/dL (ref 30.0–36.0)
MCV: 85.7 fL (ref 78.0–100.0)
MPV: 8.4 fL — ABNORMAL LOW (ref 9.4–12.4)
Platelets: 323 10*3/uL (ref 150–400)
RBC: 5.23 MIL/uL (ref 4.22–5.81)
RDW: 14.8 % (ref 11.5–15.5)
WBC: 4.9 10*3/uL (ref 4.0–10.5)

## 2014-05-09 LAB — LIPID PANEL
CHOL/HDL RATIO: 2.7 ratio
Cholesterol: 155 mg/dL (ref 0–200)
HDL: 58 mg/dL (ref >39)
LDL Cholesterol: 84 mg/dL (ref 0–99)
Triglycerides: 63 mg/dL (ref <150)
VLDL: 13 mg/dL (ref 0–40)

## 2014-05-09 MED ORDER — VALSARTAN 160 MG PO TABS: 160.0000 mg | ORAL_TABLET | Freq: Every day | ORAL | Status: DC

## 2014-05-09 MED ORDER — AMLODIPINE BESYLATE 10 MG PO TABS: 10.0000 mg | ORAL_TABLET | Freq: Every day | ORAL | Status: DC

## 2014-05-09 NOTE — Progress Notes (Signed)
Subjective:    Patient ID: Phillip Hicks, male    DOB: May 15, 1956, 58 y.o.   MRN: [id number]  PCP: No PCP Per Patient  Chief Complaint  Patient presents with  . Annual Exam   Patient Active Problem List   Diagnosis Date Noted  . Diverticulitis 05/10/2012  . HTN (hypertension) 03/23/2012   Prior to Admission medications   Medication Sig Start Date End Date Taking? Authorizing Provider  amLODipine (NORVASC) 10 MG tablet Take 1 tablet (10 mg total) by mouth daily. 05/09/14  Yes Harvir Patry, PA  valsartan (DIOVAN) 160 MG tablet Take 1 tablet (160 mg total) by mouth daily. 05/09/14  Yes Raelyn Ensignodd Ellington [place]ade, PA   Medications, allergies, past medical history, surgical history, family history, social history and problem list reviewed and updated.  HPI  58 yom with pmh htn and bph presents for annual physical.  Was last seen here one year ago for cpe. Labs at that time were all normal. TDap was given at that time. Meds were refilled.   Today he mentions he has been doing well since last visit. He mentions approx 1-2 times per year he will hurt his lower back and have to move slowly for a few days but this always resolves. He is not having any current low back pain. He still has flexeril left at home from prior episode. Otherwise denies any issues or complaints.   HTN - well controlled on norvasc and diovan. Does not check at home but well controlled in clinic today. Denies ha, cp, palps, sob, dizziness, syncope, vision changes. He has not been exercising past few months due to time constraints and lack of desire he states. He doesn't watch his sodium intake.   BPH - Hx of this many yrs ago per pt. Was having urinary stream issues and had enlarged prostate on exam so was started on finasteride many yrs ago. He thinks the sx have resolved since he's been on the med. We attempted to refill his med at visit last year but somehow he never [facility]ill [facility]. He therefore has not been  taking the finateride for past year. Denies any change in urinary stream or freq off the med.   Sees optometrist yearly. Sees dentist yearly.   Review of Systems No chest pain, SOB, HA, dizziness, vision change, N/V, diarrhea, constipation, dysuria, urinary urgency or frequency, myalgias, arthralgias or rash.     Objective:   Physical Exam  Constitutional: He is oriented to person, place, and time. He appears well-developed and well-nourished.  Non-toxic appearance. He does not have a sickly appearance. He does not appear ill. No distress.  BP 114/76 mmHg  Pulse 80  Temp(Src) 98.3 F (36.8 C)  Resp 16  Ht 5\' 11"  (1.803 m)  Wt 222 lb (100.699 kg)  BMI 30.98 kg/m2  SpO2 97%   HENT:  Right Ear: Tympanic membrane normal.  Left Ear: Tympanic membrane normal.  Mouth/Throat: Uvula is midline, oropharynx is clear and moist and mucous membranes are normal.  Eyes: Conjunctivae and EOM are normal. Pupils are equal, round, and reactive to light.  Neck: Trachea normal and normal range of motion. Carotid bruit is not present.  Cardiovascular: Normal rate, regular rhythm and normal heart sounds.  Exam reveals no gallop.   No murmur heard. Pulmonary/Chest: Effort normal and breath sounds normal. He has no decreased breath sounds. He has no wheezes. He has no rhonchi. He has no rales.  Abdominal: Soft. Normal appearance and bowel  sounds are normal. There is no tenderness.  Genitourinary: Rectum normal and prostate normal. Prostate is not enlarged and not tender.  Lymphadenopathy:       Head (right side): No submental, no submandibular and no tonsillar adenopathy present.       Head (left side): No submental, no submandibular and no tonsillar adenopathy present.    He has no cervical adenopathy.  Neurological: He is alert and oriented to person, place, and time. He has normal strength. No cranial nerve deficit or sensory deficit. He displays a negative Romberg sign.  Psychiatric: He has a normal  mood and affect. His speech is normal.      Assessment & Plan:   [age] yom with pmh htn and bph presents for annual physical.  Annual physical exam --normal vitals and exam --encouraged exercise and limiting sodium intake --rtc one year or sooner as needed --no refill of finasteride today as pt has not had any bph sx past year while off med, prostate exam normal today  Essential hypertension - Plan: amLODipine (NORVASC) 10 MG tablet, valsartan (DIOVAN) 160 MG tablet --refilled both meds --well controlled today --encouraged to purchase home cuff and record readings 1-2 times per week  Screening for prostate cancer - Plan: PSA  Screening for hyperlipidemia - Plan: Lipid panel  Screening for deficiency anemia - Plan: CBC  Screening for diabetes mellitus - Plan: Comprehensive metabolic panel  Donnajean Lopesodd M. Bernice Mullin, PA-C Physician Assistant-Certified [facility]  05/09/2014 10:21 AM

## 2014-05-09 NOTE — Patient Instructions (Signed)
Thank you for coming in for your physical exam today.  Your exam looked great. We've repeated several labs this year and will be in contact with you regarding the results in about one week. I've refilled the amlodipine and diovan today for one year. We will continue to stay off the finasteride. If you start to notice symptoms of urinary straining or change in frequency please let us know as you may need to go back on this medication. Please return to clinic with any concerns, otherwise we'll plan to see you back in one year.

## 2014-05-10 LAB — PSA: PSA: 0.38 ng/mL (ref <=4.00)

## 2014-11-24 IMAGING — CR DG LUMBAR SPINE COMPLETE 4+V
5 series · 5 of 5 positions shown · non-contrast
Comparison: None

CLINICAL DATA: Back pain lumbosacral

LUMBAR SPINE - COMPLETE 4+ VIEW

[AP]
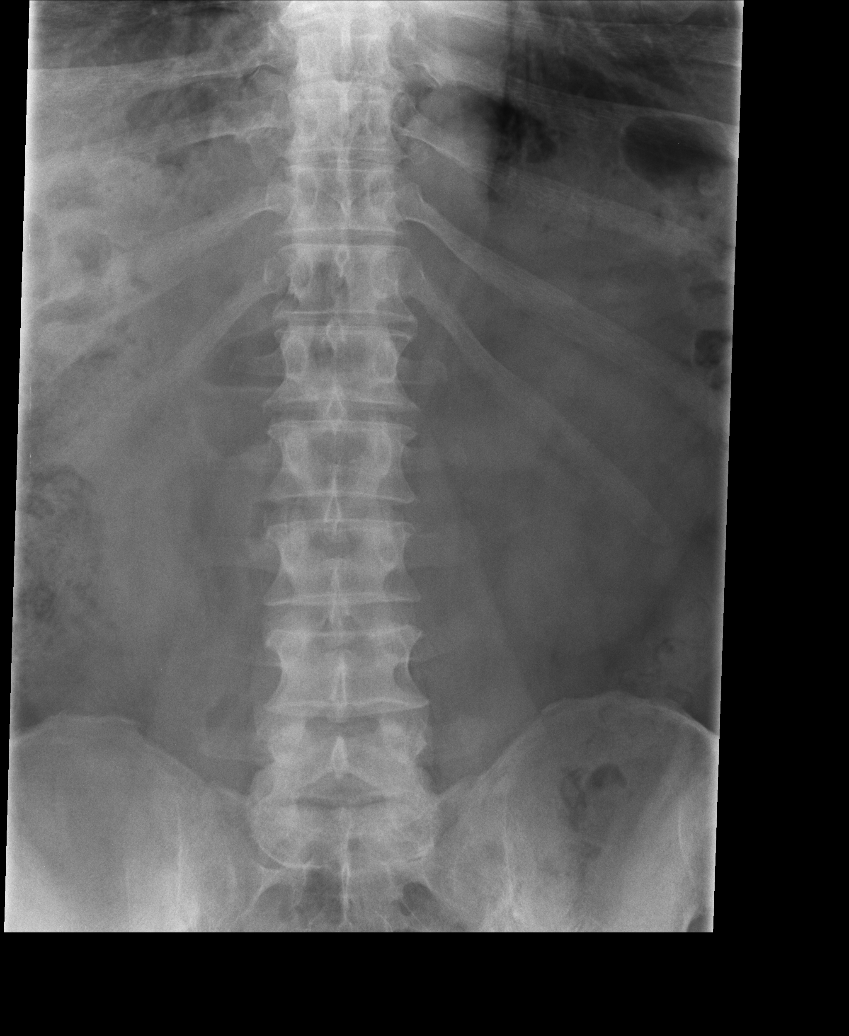

[rpo]
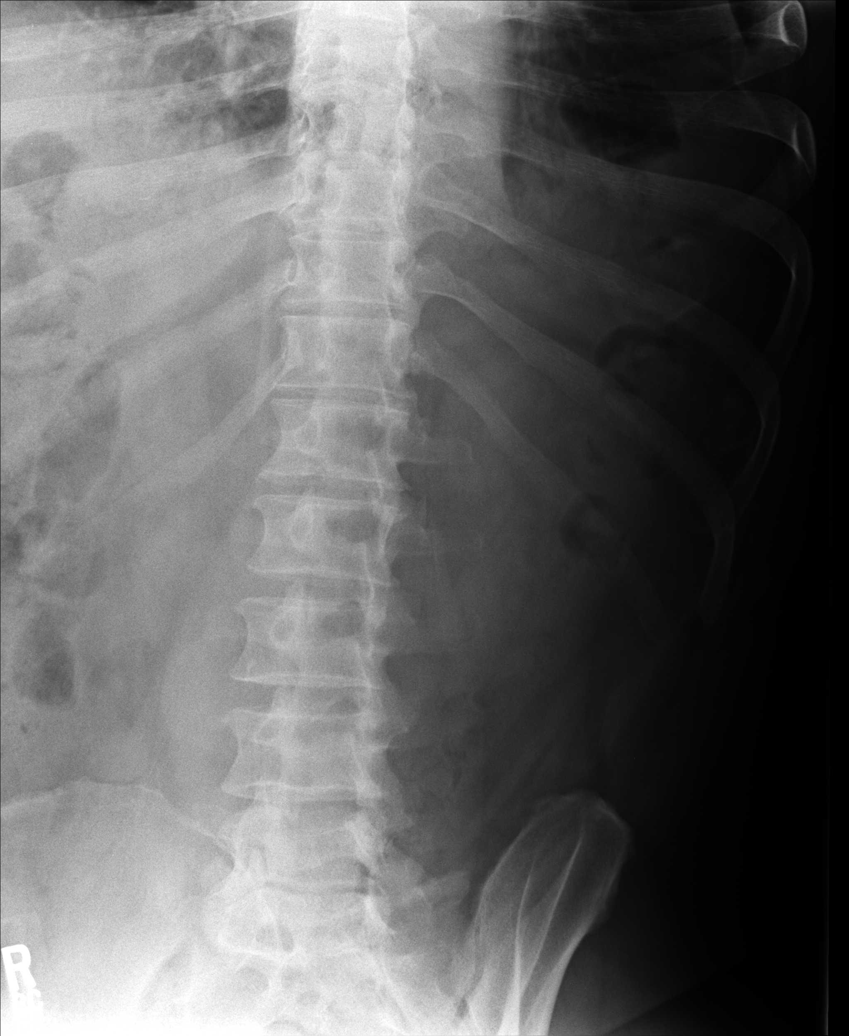

[lpo]
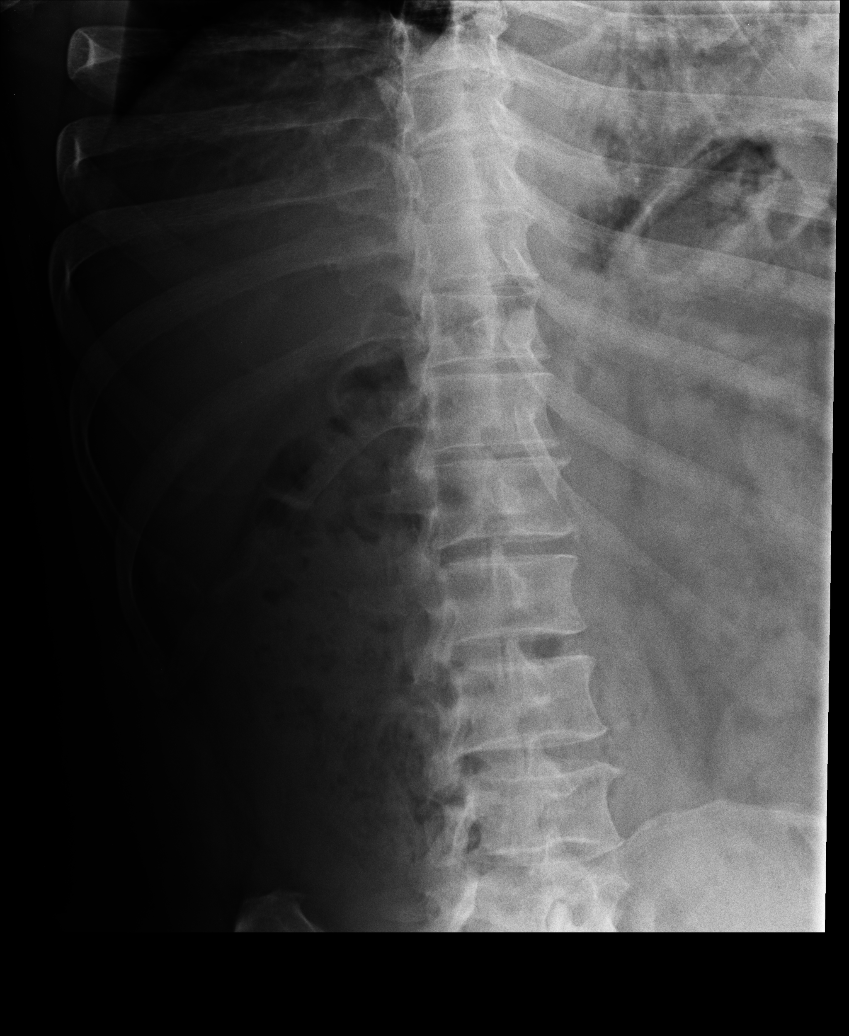

[lateral]
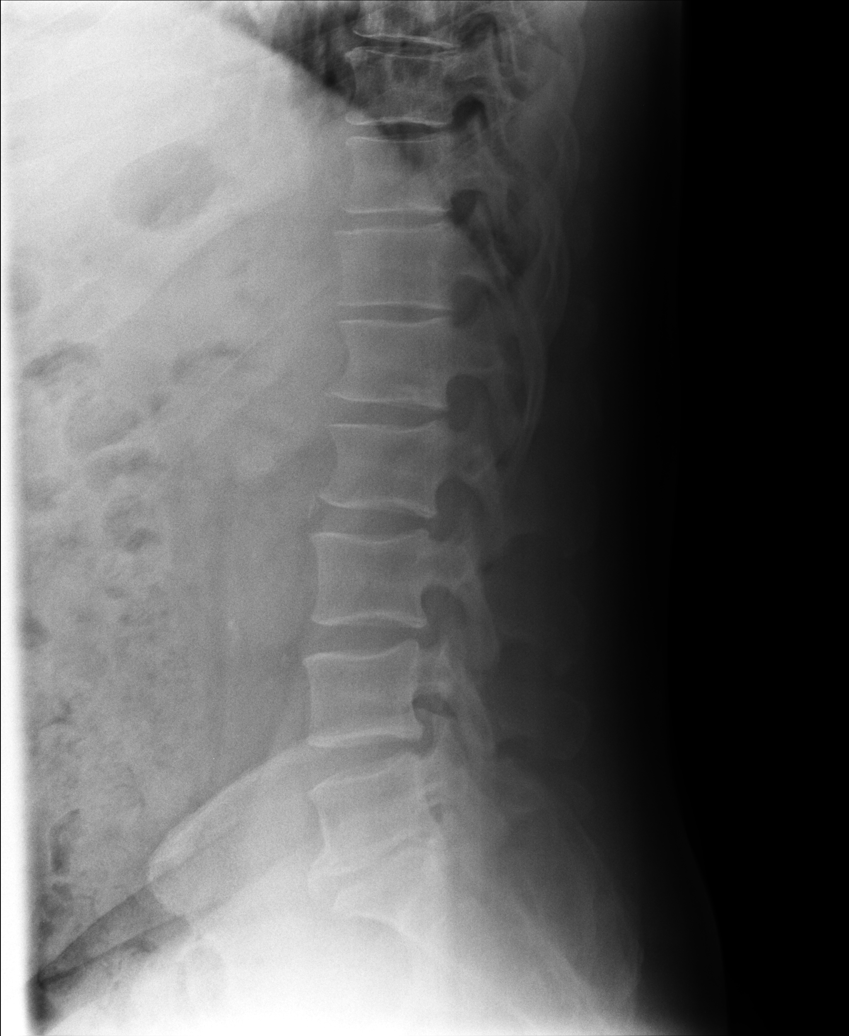

[l5 s1]
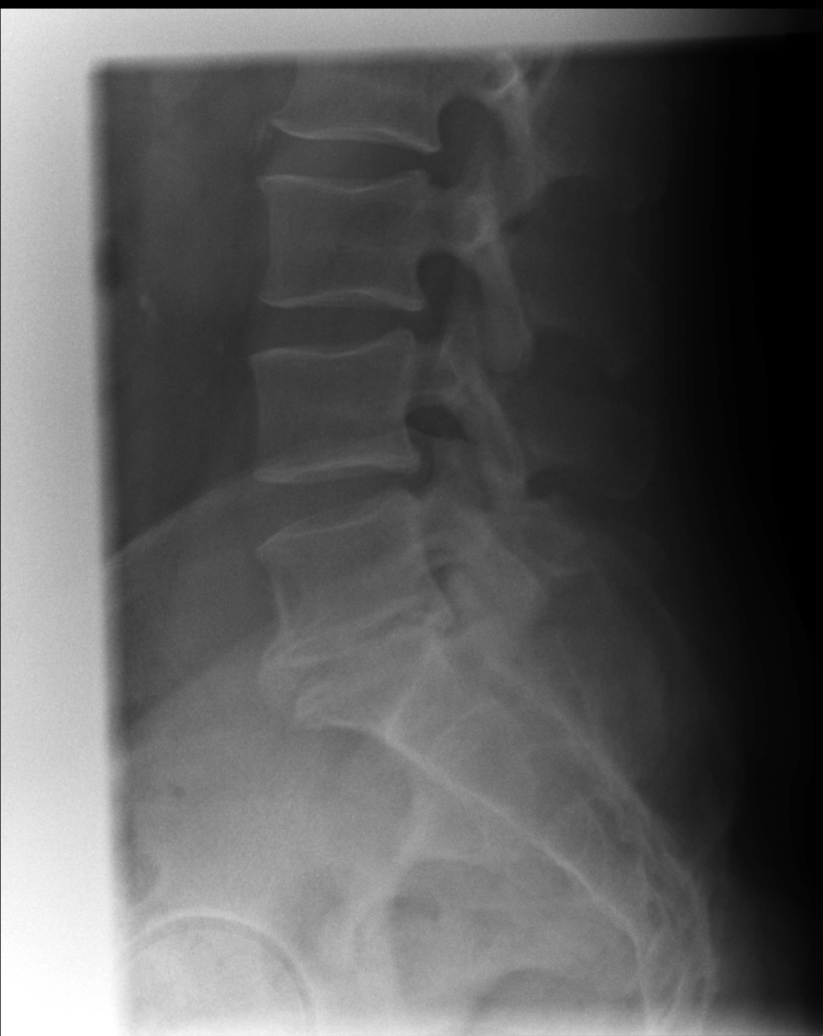

[5 of 5 positions shown; findings below may reference images not displayed]

FINDINGS: Five non-rib bearing lumbar vertebrae.
Grossly normal mineralization.
Mild facet degenerative changes lower lumbar spine.
Disc space narrowing with endplate spur formation and minimal
retrolisthesis at L5-S1.
No acute fracture, additional subluxation or bone destruction.
No spondylolysis.
SI joints symmetric.
IMPRESSION: Degenerative disc disease changes L5-S1.

## 2015-05-08 ENCOUNTER — Ambulatory Visit (INDEPENDENT_AMBULATORY_CARE_PROVIDER_SITE_OTHER): Payer: 59 | Admitting: Family Medicine

## 2015-05-08 VITALS — BP 120/84 | HR 68 | Temp 98.5°F | Resp 16 | Ht 69.1 in | Wt 226.0 lb

## 2015-05-08 DIAGNOSIS — Z0000 Encounter for general adult medical examination without abnormal findings: Secondary | ICD-10-CM | POA: Diagnosis not present

## 2015-05-08 DIAGNOSIS — Z131 Encounter for screening for diabetes mellitus: Secondary | ICD-10-CM

## 2015-05-08 DIAGNOSIS — Z125 Encounter for screening for malignant neoplasm of prostate: Secondary | ICD-10-CM | POA: Diagnosis not present

## 2015-05-08 DIAGNOSIS — I10 Essential (primary) hypertension: Secondary | ICD-10-CM

## 2015-05-08 DIAGNOSIS — N400 Enlarged prostate without lower urinary tract symptoms: Secondary | ICD-10-CM

## 2015-05-08 DIAGNOSIS — Z130 Encounter for screening for diseases of the blood and blood-forming organs and certain disorders involving the immune mechanism: Secondary | ICD-10-CM | POA: Diagnosis not present

## 2015-05-08 LAB — COMPLETE METABOLIC PANEL WITH GFR
ALT: 28 U/L (ref 9–46)
AST: 21 U/L (ref 10–35)
Albumin: 4.5 g/dL (ref 3.6–5.1)
Alkaline Phosphatase: 53 U/L (ref 40–115)
BUN: 9 mg/dL (ref 7–25)
CO2: 24 mmol/L (ref 20–31)
Chloride: 106 mmol/L (ref 98–110)
Creat: 0.82 mg/dL (ref 0.70–1.33)
GFR, Est Non African American: >89 mL/min (ref >=60)
Glucose, Bld: 98 mg/dL (ref 65–99)
Sodium: 141 mmol/L (ref 135–146)
Total Bilirubin: 0.6 mg/dL (ref 0.2–1.2)
Total Protein: 6.8 g/dL (ref 6.1–8.1)

## 2015-05-08 LAB — CBC
HCT: 43 % (ref 39.0–52.0)
Hemoglobin: 14.5 g/dL (ref 13.0–17.0)
MCH: 29.1 pg (ref 26.0–34.0)
MCHC: 33.7 g/dL (ref 30.0–36.0)
MCV: 86.2 fL (ref 78.0–100.0)
MPV: 8.3 fL — ABNORMAL LOW (ref 8.6–12.4)
Platelets: 341 10*3/uL (ref 150–400)
RBC: 4.99 MIL/uL (ref 4.22–5.81)
RDW: 14.7 % (ref 11.5–15.5)
WBC: 3.7 10*3/uL — ABNORMAL LOW (ref 4.0–10.5)

## 2015-05-08 LAB — POC MICROSCOPIC URINALYSIS (UMFC)

## 2015-05-08 LAB — POCT URINALYSIS DIP (MANUAL ENTRY)
Bilirubin, UA: NEGATIVE
Glucose, UA: NEGATIVE
Ketones, POC UA: NEGATIVE
Leukocytes, UA: NEGATIVE
Nitrite, UA: NEGATIVE
Protein Ur, POC: NEGATIVE
Spec Grav, UA: 1.025
Urobilinogen, UA: 1
pH, UA: 5.5

## 2015-05-08 LAB — LIPID PANEL
Cholesterol: 133 mg/dL (ref 125–200)
HDL: 53 mg/dL (ref >=40)
LDL Cholesterol: 69 mg/dL (ref <130)
Total CHOL/HDL Ratio: 2.5 Ratio (ref <=5.0)
Triglycerides: 57 mg/dL (ref <150)
VLDL: 11 mg/dL (ref <30)

## 2015-05-08 LAB — COMPLETE METABOLIC PANEL WITHOUT GFR
Calcium: 9.3 mg/dL (ref 8.6–10.3)
GFR, Est African American: >89 mL/min (ref >=60)
Potassium: 4.5 mmol/L (ref 3.5–5.3)

## 2015-05-08 MED ORDER — VALSARTAN 160 MG PO TABS: 160.0000 mg | ORAL_TABLET | Freq: Every day | ORAL | Status: DC

## 2015-05-08 MED ORDER — FINASTERIDE 5 MG PO TABS: 5.0000 mg | ORAL_TABLET | Freq: Every day | ORAL | Status: DC

## 2015-05-08 MED ORDER — AMLODIPINE BESYLATE 10 MG PO TABS: 10.0000 mg | ORAL_TABLET | Freq: Every day | ORAL | Status: DC

## 2015-05-08 NOTE — Progress Notes (Signed)
Chief Complaint:  Chief Complaint  Patient presents with  . Annual Exam    HPI: Phillip Hicks is a 59 y.o. male who reports to [facility] today complaining of annual exam He occ has a sharp pain in his back, 1x per year for 1 week and then stops and then does not return for 1 year, msk in origin. He has BPH and was on flomax and then on finasteride , he would like to try it bc it did help. He was on it for several years and then stopped, he was on it for 4 years.  He was on flomax ( Dr Lorenz Coaster) and after 1 year was not as effective as the finasteride UTD on flu vaccine and also Tdap in 2016 and 2014 He has no complaints HE had colonscopy in [place] 2008, normal   He lives in [place] but works in [place] for [employer]  BP Readings from Last 3 Encounters:  05/08/15 120/84  05/09/14 114/76  05/04/13 122/82     Past Medical History  Diagnosis Date  . Hypertension   . Diverticulitis    History reviewed. No pertinent past surgical history. Social History   Social History  . Marital Status: Married    Spouse Name: N/A  . Number of Children: N/A  . Years of Education: College   Occupational History  . Planning Manager    Social History Main Topics  . Smoking status: Former Smoker    Quit date: 03/23/1997  . Smokeless tobacco: None  . Alcohol Use: 4.8 oz/week    8 Standard drinks or equivalent per week  . Drug Use: No  . Sexual Activity:    Partners: Female   Other Topics Concern  . None   Social History Narrative   Family History  Problem Relation Age of Onset  . Asthma Mother   . Cancer Mother   . Hypertension Mother   . Seizures Brother   . Hypertension Sister   . Cancer Sister   . Stroke Sister   . Cancer Maternal Grandmother    No Known Allergies Prior to Admission medications   Medication Sig Start Date End Date Taking? Authorizing Provider  amLODipine (NORVASC) 10 MG tablet Take 1 tablet (10 mg total) by mouth daily. 05/09/14  Yes Todd McVeigh, PA  valsartan  (DIOVAN) 160 MG tablet Take 1 tablet (160 mg total) by mouth daily. 05/09/14  Yes Todd McVeigh, PA     ROS: The patient denies fevers, chills, night sweats, unintentional weight loss, chest pain, palpitations, wheezing, dyspnea on exertion, nausea, vomiting, abdominal pain, dysuria, hematuria, melena, numbness, weakness, or tingling.  All other systems have been reviewed and were otherwise negative with the exception of those mentioned in the HPI and as above.    PHYSICAL EXAM: Filed Vitals:   05/08/15 0837  BP: 120/84  Pulse: 68  Temp: 98.5 F (36.9 C)  Resp: 16   Body mass index is 33.28 kg/(m^2).   General: Alert, no acute distress HEENT:  Normocephalic, atraumatic, oropharynx patent. EOMI, PERRLA, fundo exam normal Cardiovascular:  Regular rate and rhythm, no rubs murmurs or gallops.  No Carotid bruits, radial pulse intact. No pedal edema.  Respiratory: Clear to auscultation bilaterally.  No wheezes, rales, or rhonchi.  No cyanosis, no use of accessory musculature Abdominal: No organomegaly, abdomen is soft and non-tender, positive bowel sounds. No masses. Skin: No rashes. Neurologic: Facial musculature symmetric. Psychiatric: Patient acts appropriately throughout our interaction. Lymphatic: No cervical or  submandibular lymphadenopathy Musculoskeletal: Gait intact. No edema, tenderness GU normal testicles, prostate enlarged, no masses    LABS: Results for orders placed or performed in visit on 05/09/14  Comprehensive metabolic panel  Result Value Ref Range   Sodium 141 135 - 145 mEq/L   Potassium 4.4 3.5 - 5.3 mEq/L   Chloride 106 96 - 112 mEq/L   CO2 25 19 - 32 mEq/L   Glucose, Bld 96 70 - 99 mg/dL   BUN 11 6 - 23 mg/dL   Creat 1.610.84 0.960.50 - 0.451.35 mg/dL   Total Bilirubin 0.7 0.2 - 1.2 mg/dL   Alkaline Phosphatase 59 39 - 117 U/L   AST 20 0 - 37 U/L   ALT 25 0 - 53 U/L   Total Protein 7.2 6.0 - 8.3 g/dL   Albumin 4.7 3.5 - 5.2 g/dL   Calcium 9.5 8.4 - 40.910.5 mg/dL    CBC  Result Value Ref Range   WBC 4.9 4.0 - 10.5 K/uL   RBC 5.23 4.22 - 5.81 MIL/uL   Hemoglobin 15.1 13.0 - 17.0 g/dL   HCT 81.144.8 91.439.0 - 78.252.0 %   MCV 85.7 78.0 - 100.0 fL   MCH 28.9 26.0 - 34.0 pg   MCHC 33.7 30.0 - 36.0 g/dL   RDW 95.614.8 21.311.5 - 08.615.5 %   Platelets 323 150 - 400 K/uL   MPV 8.4 (L) 9.4 - 12.4 fL  PSA  Result Value Ref Range   PSA 0.38 <=4.00 ng/mL  Lipid panel  Result Value Ref Range   Cholesterol 155 0 - 200 mg/dL   Triglycerides 63 <578<150 mg/dL   HDL 58 >46>39 mg/dL   Total CHOL/HDL Ratio 2.7 Ratio   VLDL 13 0 - 40 mg/dL   LDL Cholesterol 84 0 - 99 mg/dL     EKG/XRAY:   [facility]   ASSESSMENT/PLAN: Encounter Diagnoses  Name Primary?  . Annual physical exam Yes  . Screening for prostate cancer   . Screening for deficiency anemia   . Screening for diabetes mellitus   . Essential hypertension   . BPH (benign prostatic hyperplasia)    Get records from GI for colonscopy results Tetanus and flu UTD Annual labs pending Rx Finasteride, he does not want flomax , will recheck in 6 months since takes that long for fiansteride to work Fu prn   Gross sideeffects, risk and benefits, and alternatives of medications d/w patient. Patient is aware that all medications have potential sideeffects and we are unable to predict every sideeffect or drug-drug interaction that may occur.  Sieara Bremer DO  05/08/2015 9:15 AM

## 2015-05-09 LAB — PSA: PSA: 0.36 ng/mL (ref <=4.00)

## 2015-05-14 DIAGNOSIS — N400 Enlarged prostate without lower urinary tract symptoms: Secondary | ICD-10-CM | POA: Insufficient documentation

## 2015-05-16 ENCOUNTER — Encounter: Payer: Self-pay | Admitting: Family Medicine

## 2015-05-29 ENCOUNTER — Encounter: Payer: Self-pay | Admitting: Family Medicine

## 2015-07-30 ENCOUNTER — Ambulatory Visit (INDEPENDENT_AMBULATORY_CARE_PROVIDER_SITE_OTHER): Payer: 59 | Admitting: Family Medicine

## 2015-07-30 VITALS — BP 120/76 | HR 72 | Temp 97.9°F | Resp 20 | Ht 69.1 in | Wt 228.2 lb

## 2015-07-30 DIAGNOSIS — J111 Influenza due to unidentified influenza virus with other respiratory manifestations: Secondary | ICD-10-CM | POA: Diagnosis not present

## 2015-07-30 MED ORDER — OSELTAMIVIR PHOSPHATE 75 MG PO CAPS: 75.0000 mg | ORAL_CAPSULE | Freq: Two times a day (BID) | ORAL | Status: DC

## 2015-07-30 NOTE — Patient Instructions (Addendum)

## 2015-07-30 NOTE — Progress Notes (Signed)
   Subjective:    Patient ID: Phillip Hicks, male    DOB: 1955-10-11, 60 y.o.   MRN: [id number] By signing my name below, I, Phillip Hicks, attest that this documentation has been prepared under the direction and in the presence of Phillip SidleKurt Diella Gillingham, MD.  Electronically Signed: Littie Deedsichard Hicks, Medical Scribe. 07/30/2015. 2:06 PM.  HPI HPI Comments: Phillip Hicks is a 60 y.o. male who presents to the [facility] complaining of gradual onset chills that started yesterday. Patient reports having associated generalized myalgias. He also had a fever last night. He received the flu shot last September. Patient believes he may have the flu. He has a history of hypertension.   Patient works for [employer].  Review of Systems  Constitutional: Positive for fever and chills.  Musculoskeletal: Positive for myalgias.       Objective:   Physical Exam CONSTITUTIONAL: Well developed/well nourished HEAD: Normocephalic/atraumatic EYES: EOM/PERRL ENMT: Mucous membranes moist. Uvula inflamed. NECK: supple no meningeal signs SPINE: entire spine nontender CV: S1/S2 noted, no murmurs/rubs/gallops noted LUNGS: Lungs are clear to auscultation bilaterally, no apparent distress ABDOMEN: soft, nontender, no rebound or guarding GU: no cva tenderness NEURO: Pt is awake/alert, moves all extremitiesx4 EXTREMITIES: pulses normal, full ROM SKIN: warm, color normal PSYCH: no abnormalities of mood noted      Assessment & Plan:   This chart was scribed in my presence and reviewed by me personally.    ICD-9-CM ICD-10-CM   1. Influenza with respiratory manifestation 487.1 J11.1 oseltamivir (TAMIFLU) 75 MG capsule     Signed, Phillip SidleKurt Login Muckleroy, MD

## 2015-10-15 ENCOUNTER — Other Ambulatory Visit: Payer: Self-pay | Admitting: Family Medicine

## 2016-03-11 ENCOUNTER — Other Ambulatory Visit: Payer: Self-pay | Admitting: Urgent Care

## 2016-03-11 NOTE — Telephone Encounter (Signed)
Voice mail left for pt to make appointment for office visit before any refills for meds.

## 2016-03-14 NOTE — Telephone Encounter (Signed)
Voice message left for pt to call and schedule appointment.

## 2016-03-15 NOTE — Telephone Encounter (Signed)
Will provide patient with a courtesy refill so that he can make his appointment.

## 2016-04-23 ENCOUNTER — Ambulatory Visit (INDEPENDENT_AMBULATORY_CARE_PROVIDER_SITE_OTHER): Payer: 59 | Admitting: Physician Assistant

## 2016-04-23 VITALS — BP 118/90 | HR 72 | Temp 98.5°F | Resp 18 | Ht 70.0 in | Wt 234.4 lb

## 2016-04-23 DIAGNOSIS — Z0000 Encounter for general adult medical examination without abnormal findings: Secondary | ICD-10-CM | POA: Diagnosis not present

## 2016-04-23 DIAGNOSIS — Z13220 Encounter for screening for lipoid disorders: Secondary | ICD-10-CM

## 2016-04-23 DIAGNOSIS — Z13228 Encounter for screening for other metabolic disorders: Secondary | ICD-10-CM

## 2016-04-23 DIAGNOSIS — N401 Enlarged prostate with lower urinary tract symptoms: Secondary | ICD-10-CM | POA: Diagnosis not present

## 2016-04-23 DIAGNOSIS — Z125 Encounter for screening for malignant neoplasm of prostate: Secondary | ICD-10-CM

## 2016-04-23 DIAGNOSIS — I10 Essential (primary) hypertension: Secondary | ICD-10-CM

## 2016-04-23 DIAGNOSIS — Z130 Encounter for screening for diseases of the blood and blood-forming organs and certain disorders involving the immune mechanism: Secondary | ICD-10-CM

## 2016-04-23 MED ORDER — VALSARTAN 160 MG PO TABS: 160.0000 mg | ORAL_TABLET | Freq: Every day | ORAL | 3 refills | Status: DC

## 2016-04-23 MED ORDER — AMLODIPINE BESYLATE 10 MG PO TABS: 10.0000 mg | ORAL_TABLET | Freq: Every day | ORAL | 3 refills | Status: DC

## 2016-04-23 MED ORDER — FINASTERIDE 5 MG PO TABS: 5.0000 mg | ORAL_TABLET | Freq: Every day | ORAL | 3 refills | Status: DC

## 2016-04-23 NOTE — Patient Instructions (Addendum)
Please try to incorporate aerobic (heart pumping) activity 30 minutes, 4 times per week. Please increase your water intake to 64 oz per day.   Keeping you healthy  Get these tests  Blood pressure- Have your blood pressure checked once a year by your healthcare provider.  Normal blood pressure is 120/80.  Weight- Have your body mass index (BMI) calculated to screen for obesity.  BMI is a measure of body fat based on height and weight. You can also calculate your own BMI at https://www.west-esparza.com/www.nhlbisupport.com/bmi/.  Cholesterol- Have your cholesterol checked regularly starting at age 59, sooner may be necessary if you have diabetes, high blood pressure, if a family member developed heart diseases at an early age or if you smoke.   Chlamydia, HIV, and other sexual transmitted disease- Get screened each year until the age of 59 then within three months of each new sexual partner.  Diabetes- Have your blood sugar checked regularly if you have high blood pressure, high cholesterol, a family history of diabetes or if you are overweight.  Get these vaccines  Flu shot- Every fall.  Tetanus shot- Every 10 years.  Menactra- Single dose; prevents meningitis.  Take these steps  Don't smoke- If you do smoke, ask your healthcare provider about quitting. For tips on how to quit, go to www.smokefree.gov or call 1-800-QUIT-NOW.  Be physically active- Exercise 5 days a week for at least 30 minutes.  If you are not already physically active start slow and gradually work up to 30 minutes of moderate physical activity.  Examples of moderate activity include walking briskly, mowing the yard, dancing, swimming bicycling, etc.  Eat a healthy diet- Eat a variety of healthy foods such as fruits, vegetables, low fat milk, low fat cheese, yogurt, lean meats, poultry, fish, beans, tofu, etc.  For more information on healthy eating, go to www.thenutritionsource.org  Drink alcohol in moderation- Limit alcohol intake two drinks or  less a day.  Never drink and drive.  Dentist- Brush and floss teeth twice daily; visit your dentis twice a year.  Depression-Your emotional health is as important as your physical health.  If you're feeling down, losing interest in things you normally enjoy please talk with your healthcare provider.  Gun Safety- If you keep a gun in your home, keep it unloaded and with the safety lock on.  Bullets should be stored separately.  Helmet use- Always wear a helmet when riding a motorcycle, bicycle, rollerblading or skateboarding.  Safe sex- If you may be exposed to a sexually transmitted infection, use a condom  Seat belts- Seat bels can save your life; always wear one.  Smoke/Carbon Monoxide detectors- These detectors need to be installed on the appropriate level of your home.  Replace batteries at least once a year.  Skin Cancer- When out in the sun, cover up and use sunscreen SPF 15 or higher. Violence- If anyone is threatening or hurting you, please tell your healthcare provider.    IF you received an x-ray today, you will receive an invoice from [facility]. Please contact [facility] at [phone] with questions or concerns regarding your invoice.   IF you received labwork today, you will receive an invoice from [facility]. Please contact [facility] at [phone] with questions or concerns regarding your invoice.   Our billing staff will not be able to assist you with questions regarding bills from these companies.  You will be contacted with the lab results as soon as they are available. The fastest  way to get your results is to activate your My Chart account. Instructions are located on the last page of this paperwork. If you have not heard from [place] regarding the results in 2 weeks, please contact this office.

## 2016-04-23 NOTE — Progress Notes (Signed)
[facility] [address] [place] [address] [phone]  Date:  04/23/2016   Name:  Phillip Hicks   DOB:  01/15/1956   MRN:  [id number]  PCP:  No PCP Per Patient    History of Present Illness:  Phillip Hicks is a 60 y.o. male patient who presents to [facility] for annual physical exam, and medication refill.  Diet: subs, fastfood, and veggies at home.  He lives in [place] , but lives in [place].  Occasional sodas.  Water intake is 3 cups.  about 3 cups of coffee  BM: regular.  Light constipation at times.  No blood in stool or black stool  Urination: some frequency secondary to prostate.  Finasteride has helped.  No hematuria, dysuria.    Sleeps: 6-7 hours.  No difficulty getting asleep.  He wakes but can get back to slumber.  Social activity: travel to [place] and [place], visiting family.  Relocated with job and has a 2 hour trek, but has apartment near by and goes home twice per week. Exercise: none at this time.   EtOH: 1-2 drinks per day Illicit drug use: none Tobacco or vaping: none   Flu vaccine received at work. Colonoscopy in 2008 htnTaking medications daily denies side effects. Bph: taken as prescribed.  No se as well.  Urine more controlled on the medication.  May urinate out of habit during the night, then actual sensation or needing to urinate.   Patient Active Problem List   Diagnosis Date Noted  . BPH (benign prostatic hyperplasia) 05/14/2015  . Diverticulitis 05/10/2012  . HTN (hypertension) 03/23/2012    Past Medical History:  Diagnosis Date  . Diverticulitis   . Hypertension     No past surgical history on file.  Social History  Substance Use Topics  . Smoking status: Former Smoker    Quit date: 03/23/1997  . Smokeless tobacco: Not on file  . Alcohol use 4.8 oz/week    8 Standard drinks or equivalent per week    Family History  Problem Relation Age of Onset  . Asthma Mother   . Cancer Mother   . Hypertension  Mother   . Seizures Brother   . Hypertension Sister   . Cancer Sister   . Stroke Sister   . Cancer Maternal Grandmother     No Known Allergies  Medication list has been reviewed and updated.  Current Outpatient Prescriptions on File Prior to Visit  Medication Sig Dispense Refill  . amLODipine (NORVASC) 10 MG tablet Take 1 tablet (10 mg total) by mouth daily. 90 tablet 3  . finasteride (PROSCAR) 5 MG tablet TAKE 1 TABLET DAILY (NEED OFFICE VISIT FOR ADDITIONAL REFILLS) 30 tablet 0  . valsartan (DIOVAN) 160 MG tablet Take 1 tablet (160 mg total) by mouth daily. 90 tablet 3   No current facility-administered medications on file prior to visit.     Review of Systems  Constitutional: Negative for chills and fever.  HENT: Negative for ear discharge, ear pain and sore throat.   Eyes: Negative for blurred vision and double vision.  Respiratory: Negative for cough, shortness of breath and wheezing.   Cardiovascular: Negative for chest pain, palpitations and leg swelling.  Gastrointestinal: Negative for diarrhea, nausea and vomiting.  Genitourinary: Negative for dysuria, frequency and hematuria.  Skin: Negative for itching and rash.  Neurological: Negative for dizziness and headaches.     Physical Examination: BP 118/90 (BP Location: Right Arm, Patient Position: Sitting,  Cuff Size: Large)   Pulse 72   Temp 98.5 F (36.9 C) (Oral)   Resp 18   Ht 5' 10"  (1.778 m)   Wt 234 lb 6.4 oz (106.3 kg)   SpO2 97%   BMI 33.63 kg/m  Ideal Body Weight: Weight in (lb) to have BMI = 25: 173.9  Physical Exam  Constitutional: He is oriented to person, place, and time. He appears well-developed and well-nourished. No distress.  HENT:  Head: Normocephalic and atraumatic.  Right Ear: Tympanic membrane, external ear and ear canal normal.  Left Ear: Tympanic membrane, external ear and ear canal normal.  Eyes: Conjunctivae and EOM are normal. Pupils are equal, round, and reactive to light.   Cardiovascular: Normal rate and regular rhythm.  Exam reveals no friction rub.   No murmur heard. Pulmonary/Chest: Effort normal. No respiratory distress. He has no wheezes.  Abdominal: Soft. Bowel sounds are normal. He exhibits no distension and no mass. There is no tenderness.  Musculoskeletal: Normal range of motion. He exhibits no edema or tenderness.  Neurological: He is alert and oriented to person, place, and time. He displays normal reflexes.  Skin: Skin is warm and dry. He is not diaphoretic.  Psychiatric: He has a normal mood and affect. His behavior is normal.     Assessment and Plan: Phillip Hicks is a 60 y.o. male who is here today for annual physical exam and medication refill Refills given bph symptomatically stable htn stable Advised exercise, and increase water intake.  Annual physical exam - Plan: CBC, CMP14+EGFR, Lipid panel, PSA, amLODipine (NORVASC) 10 MG tablet, finasteride (PROSCAR) 5 MG tablet, valsartan (DIOVAN) 160 MG tablet, DISCONTINUED: finasteride (PROSCAR) 5 MG tablet, DISCONTINUED: valsartan (DIOVAN) 160 MG tablet  Screening for deficiency anemia - Plan: CBC, CMP14+EGFR, Lipid panel, PSA, amLODipine (NORVASC) 10 MG tablet, finasteride (PROSCAR) 5 MG tablet, valsartan (DIOVAN) 160 MG tablet, DISCONTINUED: finasteride (PROSCAR) 5 MG tablet, DISCONTINUED: valsartan (DIOVAN) 160 MG tablet  Screening for metabolic disorder - Plan: CBC, CMP14+EGFR, Lipid panel, PSA, amLODipine (NORVASC) 10 MG tablet, finasteride (PROSCAR) 5 MG tablet, valsartan (DIOVAN) 160 MG tablet, DISCONTINUED: finasteride (PROSCAR) 5 MG tablet, DISCONTINUED: valsartan (DIOVAN) 160 MG tablet  Screening for lipid disorders - Plan: CBC, CMP14+EGFR, Lipid panel, PSA, amLODipine (NORVASC) 10 MG tablet, finasteride (PROSCAR) 5 MG tablet, valsartan (DIOVAN) 160 MG tablet, DISCONTINUED: finasteride (PROSCAR) 5 MG tablet, DISCONTINUED: valsartan (DIOVAN) 160 MG tablet  Screening for prostate cancer -  Plan: CBC, CMP14+EGFR, Lipid panel, PSA, amLODipine (NORVASC) 10 MG tablet, finasteride (PROSCAR) 5 MG tablet, valsartan (DIOVAN) 160 MG tablet, DISCONTINUED: finasteride (PROSCAR) 5 MG tablet, DISCONTINUED: valsartan (DIOVAN) 160 MG tablet  Essential hypertension - Plan: CBC, CMP14+EGFR, Lipid panel, PSA, amLODipine (NORVASC) 10 MG tablet, finasteride (PROSCAR) 5 MG tablet, valsartan (DIOVAN) 160 MG tablet, DISCONTINUED: amLODipine (NORVASC) 10 MG tablet, DISCONTINUED: finasteride (PROSCAR) 5 MG tablet, DISCONTINUED: valsartan (DIOVAN) 160 MG tablet  Benign prostatic hyperplasia with lower urinary tract symptoms, symptom details unspecified - Plan: CBC, CMP14+EGFR, Lipid panel, PSA, amLODipine (NORVASC) 10 MG tablet, finasteride (PROSCAR) 5 MG tablet, valsartan (DIOVAN) 160 MG tablet, DISCONTINUED: finasteride (PROSCAR) 5 MG tablet, DISCONTINUED: valsartan (DIOVAN) 160 MG tablet  Ivar Drape, PA-C [facility] 12/6/201710:26 AM

## 2016-04-24 LAB — CMP14+EGFR
A/G RATIO: 2 (ref 1.2–2.2)
ALT: 25 IU/L (ref 0–44)
AST: 21 IU/L (ref 0–40)
Albumin: 4.7 g/dL (ref 3.5–5.5)
Alkaline Phosphatase: 60 IU/L (ref 39–117)
BUN/Creatinine Ratio: 8 — ABNORMAL LOW (ref 9–20)
BUN: 6 mg/dL (ref 6–24)
Bilirubin Total: 0.5 mg/dL (ref 0.0–1.2)
CALCIUM: 9.6 mg/dL (ref 8.7–10.2)
CHLORIDE: 105 mmol/L (ref 96–106)
CO2: 24 mmol/L (ref 18–29)
Creatinine, Ser: 0.77 mg/dL (ref 0.76–1.27)
GFR calc Af Amer: 116 mL/min/{1.73_m2} (ref >59)
GFR, EST NON AFRICAN AMERICAN: 100 mL/min/{1.73_m2} (ref >59)
GLUCOSE: 91 mg/dL (ref 65–99)
Globulin, Total: 2.3 g/dL (ref 1.5–4.5)
POTASSIUM: 4.6 mmol/L (ref 3.5–5.2)
Sodium: 143 mmol/L (ref 134–144)
Total Protein: 7 g/dL (ref 6.0–8.5)

## 2016-04-24 LAB — LIPID PANEL
CHOL/HDL RATIO: 2.5 ratio (ref 0.0–5.0)
Cholesterol, Total: 160 mg/dL (ref 100–199)
HDL: 63 mg/dL (ref >39)
LDL Calculated: 83 mg/dL (ref 0–99)
TRIGLYCERIDES: 70 mg/dL (ref 0–149)
VLDL Cholesterol Cal: 14 mg/dL (ref 5–40)

## 2016-04-24 LAB — CBC
Hematocrit: 44.1 % (ref 37.5–51.0)
Hemoglobin: 14.9 g/dL (ref 13.0–17.7)
MCH: 29.6 pg (ref 26.6–33.0)
MCHC: 33.8 g/dL (ref 31.5–35.7)
MCV: 88 fL (ref 79–97)
PLATELETS: 313 10*3/uL (ref 150–379)
RBC: 5.04 x10E6/uL (ref 4.14–5.80)
RDW: 15.5 % — AB (ref 12.3–15.4)
WBC: 4.7 10*3/uL (ref 3.4–10.8)

## 2016-04-24 LAB — PSA: Prostate Specific Ag, Serum: 0.1 ng/mL (ref 0.0–4.0)

## 2016-04-25 ENCOUNTER — Encounter: Payer: Self-pay | Admitting: Physician Assistant

## 2016-07-04 ENCOUNTER — Encounter: Payer: Self-pay | Admitting: Physician Assistant

## 2016-07-04 ENCOUNTER — Ambulatory Visit (INDEPENDENT_AMBULATORY_CARE_PROVIDER_SITE_OTHER): Payer: 59 | Admitting: Physician Assistant

## 2016-07-04 VITALS — BP 120/82 | HR 84 | Temp 98.2°F | Ht 70.0 in | Wt 234.2 lb

## 2016-07-04 DIAGNOSIS — B9789 Other viral agents as the cause of diseases classified elsewhere: Secondary | ICD-10-CM | POA: Diagnosis not present

## 2016-07-04 DIAGNOSIS — J988 Other specified respiratory disorders: Secondary | ICD-10-CM

## 2016-07-04 MED ORDER — GUAIFENESIN ER 1200 MG PO TB12: 1.0000 | ORAL_TABLET | Freq: Two times a day (BID) | ORAL | 1 refills | Status: DC | PRN

## 2016-07-04 MED ORDER — BENZONATATE 100 MG PO CAPS: 100.0000 mg | ORAL_CAPSULE | Freq: Three times a day (TID) | ORAL | 0 refills | Status: DC | PRN

## 2016-07-04 MED ORDER — PREDNISONE 20 MG PO TABS
ORAL_TABLET | ORAL | 0 refills | Status: DC
Start: 2016-07-04 — End: 2017-01-14

## 2016-07-04 NOTE — Progress Notes (Signed)
[facility] [address] [place] [address] [phone]  Date:  07/04/2016   Name:  Phillip Hicks   DOB:  [birth year]   MRN:  [id number]  PCP:  Pcp Not In System    History of Present Illness:  Phillip Hicks is a 61 y.o. male patient who presents to [facility] for cc of cough.    Symptoms started 2 days ago with post nasal drip.  He never coughs, and  Intermittent.  Clear productive mucus.   No sob or dyspnea.  No congestion.  No sore throat.  No ear pain.  No fever.  He received flu vaccine this year.   No sick contacts.  He has not taken anything for the cough. He reports that he was having some wheezing last night.   Patient Active Problem List   Diagnosis Date Noted  . BPH (benign prostatic hyperplasia) 05/14/2015  . Diverticulitis 05/10/2012  . HTN (hypertension) 03/23/2012    Past Medical History:  Diagnosis Date  . Diverticulitis   . Hypertension     History reviewed. No pertinent surgical history.  Social History  Substance Use Topics  . Smoking status: Former Smoker    Quit date: 03/23/1997  . Smokeless tobacco: Never Used  . Alcohol use 4.8 oz/week    8 Standard drinks or equivalent per week    Family History  Problem Relation Age of Onset  . Asthma Mother   . Cancer Mother   . Hypertension Mother   . Seizures Brother   . Hypertension Sister   . Cancer Sister   . Stroke Sister   . Cancer Maternal Grandmother     No Known Allergies  Medication list has been reviewed and updated.  Current Outpatient Prescriptions on File Prior to Visit  Medication Sig Dispense Refill  . amLODipine (NORVASC) 10 MG tablet Take 1 tablet (10 mg total) by mouth daily. 90 tablet 3  . finasteride (PROSCAR) 5 MG tablet Take 1 tablet (5 mg total) by mouth daily. 90 tablet 3  . valsartan (DIOVAN) 160 MG tablet Take 1 tablet (160 mg total) by mouth daily. 90 tablet 3   No current facility-administered medications on file prior to visit.     ROS ROS  otherwise unremarkable unless listed above.   Physical Examination: BP 120/82 (BP Location: Right Arm, Patient Position: Sitting, Cuff Size: Large)   Pulse 84   Temp 98.2 F (36.8 C) (Oral)   Ht 5\' 10"  (1.778 m)   Wt 234 lb 3.2 oz (106.2 kg)   SpO2 96%   BMI 33.60 kg/m  Ideal Body Weight: Weight in (lb) to have BMI = 25: 173.9  Physical Exam  Constitutional: He is oriented to person, place, and time. He appears well-developed and well-nourished. No distress.  HENT:  Head: Normocephalic and atraumatic.  Right Ear: Tympanic membrane, external ear and ear canal normal.  Left Ear: Tympanic membrane, external ear and ear canal normal.  Nose: Mucosal edema and rhinorrhea present. Right sinus exhibits no maxillary sinus tenderness and no frontal sinus tenderness. Left sinus exhibits no maxillary sinus tenderness and no frontal sinus tenderness.  Mouth/Throat: No uvula swelling. No oropharyngeal exudate, posterior oropharyngeal edema or posterior oropharyngeal erythema.  Eyes: Conjunctivae, EOM and lids are normal. Pupils are equal, round, and reactive to light. Right eye exhibits normal extraocular motion. Left eye exhibits normal extraocular motion.  Neck: Trachea normal and full passive range of motion without pain. No edema and no  erythema present.  Cardiovascular: Normal rate.   Pulmonary/Chest: Effort normal. No respiratory distress. He has no decreased breath sounds. He has no wheezes. He has no rhonchi.  Neurological: He is alert and oriented to person, place, and time.  Skin: Skin is warm and dry. He is not diaphoretic.  Psychiatric: He has a normal mood and affect. His behavior is normal.     Assessment and Plan: [name] is a 61 y.o. male who is here today for cc of cough.  If no improvement within 1 week, advised to contact me.  Sob or dyspnea, he will rtc.  Viral respiratory illness - Plan: Guaifenesin (MUCINEX MAXIMUM STRENGTH) 1200 MG TB12, benzonatate (TESSALON) 100  MG capsule, predniSONE (DELTASONE) 20 MG tablet  Trena PlattStephanie English, PA-C [facility] 2/17/201810:21 AM

## 2016-07-04 NOTE — Patient Instructions (Addendum)
Viral Respiratory Infection Introduction A viral respiratory infection is an illness that affects parts of the body used for breathing, like the lungs, nose, and throat. It is caused by a germ called a virus. Some examples of this kind of infection are:  A cold.  The flu (influenza).  A respiratory syncytial virus (RSV) infection. How do I know if I have this infection? Most of the time this infection causes:  A stuffy or runny nose.  Yellow or green fluid in the nose.  A cough.  Sneezing.  Tiredness (fatigue).  Achy muscles.  A sore throat.  Sweating or chills.  A fever.  A headache. How is this infection treated? If the flu is diagnosed early, it may be treated with an antiviral medicine. This medicine shortens the length of time a person has symptoms. Symptoms may be treated with over-the-counter and prescription medicines, such as:  Expectorants. These make it easier to cough up mucus.  Decongestant nasal sprays. Doctors do not prescribe antibiotic medicines for viral infections. They do not work with this kind of infection. How do I know if I should stay home? To keep others from getting sick, stay home if you have:  A fever.  A lasting cough.  A sore throat.  A runny nose.  Sneezing.  Muscles aches.  Headaches.  Tiredness.  Weakness.  Chills.  Sweating.  An upset stomach (nausea). Follow these instructions at home:  Rest as much as possible.  Take over-the-counter and prescription medicines only as told by your doctor.  Drink enough fluid to keep your pee (urine) clear or pale yellow.  Gargle with salt water. Do this 3-4 times per day or as needed. To make a salt-water mixture, dissolve -1 tsp of salt in 1 cup of warm water. Make sure the salt dissolves all the way.  Use nose drops made from salt water. This helps with stuffiness (congestion). It also helps soften the skin around your nose.  Do not drink alcohol.  Do not use  tobacco products, including cigarettes, chewing tobacco, and e-cigarettes. If you need help quitting, ask your doctor. Get help if:  Your symptoms last for 10 days or longer.  Your symptoms get worse over time.  You have a fever.  You have very bad pain in your face or forehead.  Parts of your jaw or neck become very swollen. Get help right away if:  You feel pain or pressure in your chest.  You have shortness of breath.  You faint or feel like you will faint.  You keep throwing up (vomiting).  You feel confused. This information is not intended to replace advice given to you by your health care provider. Make sure you discuss any questions you have with your health care provider. Document Released: 04/17/2008 Document Revised: 10/11/2015 Document Reviewed: 10/11/2014  2017 Elsevier     IF you received an x-ray today, you will receive an invoice from [facility]. Please contact [facility] at [phone] with questions or concerns regarding your invoice.   IF you received labwork today, you will receive an invoice from [facility]. Please contact [facility] at [phone] with questions or concerns regarding your invoice.   Our billing staff will not be able to assist you with questions regarding bills from these companies.  You will be contacted with the lab results as soon as they are available. The fastest way to get your results is to activate your My Chart account. Instructions are located on the last page of  this paperwork. If you have not heard from [place] regarding the results in 2 weeks, please contact this office.

## 2016-12-24 ENCOUNTER — Telehealth: Payer: Self-pay | Admitting: Physician Assistant

## 2016-12-24 NOTE — Telephone Encounter (Signed)
PATIENT CALLED TO SAY HE RECEIVED A LETTER FROM [facility] REGARDING THE RECALL ON HIS VALSARTAN. I READ THE HANDOUT THAT WE HAVE BEEN GIVEN TO READ TO THE PATIENT. HE TRIED TO CALL THE 1 [phone]) # FOR THE [facility] BUT HE COULD NOT GET AN ANSWER. SO HE CALLED [facility] AND THEY HELPED HIM. THEY SAID HIS WAS AFFECTED BUT THAT THEY WOULD SEND HIM A REPLACEMENT. BEST PHONE IF QUESTIONS: [phone] (CELL) MBC

## 2016-12-24 NOTE — Telephone Encounter (Signed)
See note below

## 2016-12-25 ENCOUNTER — Telehealth: Payer: Self-pay

## 2016-12-25 NOTE — Telephone Encounter (Signed)
Spoke with patient and he states that [facility] will replace Valsartan. Pt said he didn't need anything else.

## 2016-12-25 NOTE — Telephone Encounter (Signed)
Great, thanks for letting me know. Can you ask how long he has been without and when the expected time.  I do not want him waiting longer than 7 days without it.

## 2017-01-14 ENCOUNTER — Ambulatory Visit (INDEPENDENT_AMBULATORY_CARE_PROVIDER_SITE_OTHER): Payer: 59 | Admitting: Physician Assistant

## 2017-01-14 ENCOUNTER — Encounter: Payer: Self-pay | Admitting: Physician Assistant

## 2017-01-14 VITALS — BP 125/86 | HR 79 | Temp 98.8°F | Resp 18 | Ht 70.0 in | Wt 230.6 lb

## 2017-01-14 DIAGNOSIS — Z130 Encounter for screening for diseases of the blood and blood-forming organs and certain disorders involving the immune mechanism: Secondary | ICD-10-CM

## 2017-01-14 DIAGNOSIS — Z0000 Encounter for general adult medical examination without abnormal findings: Secondary | ICD-10-CM | POA: Diagnosis not present

## 2017-01-14 DIAGNOSIS — Z1329 Encounter for screening for other suspected endocrine disorder: Secondary | ICD-10-CM

## 2017-01-14 DIAGNOSIS — Z1389 Encounter for screening for other disorder: Secondary | ICD-10-CM | POA: Diagnosis not present

## 2017-01-14 DIAGNOSIS — N401 Enlarged prostate with lower urinary tract symptoms: Secondary | ICD-10-CM

## 2017-01-14 DIAGNOSIS — I10 Essential (primary) hypertension: Secondary | ICD-10-CM | POA: Diagnosis not present

## 2017-01-14 DIAGNOSIS — Z23 Encounter for immunization: Secondary | ICD-10-CM

## 2017-01-14 DIAGNOSIS — Z13220 Encounter for screening for lipoid disorders: Secondary | ICD-10-CM

## 2017-01-14 DIAGNOSIS — Z125 Encounter for screening for malignant neoplasm of prostate: Secondary | ICD-10-CM

## 2017-01-14 DIAGNOSIS — Z13228 Encounter for screening for other metabolic disorders: Secondary | ICD-10-CM

## 2017-01-14 LAB — POCT URINALYSIS DIP (MANUAL ENTRY)
BILIRUBIN UA: NEGATIVE mg/dL
Bilirubin, UA: NEGATIVE
Blood, UA: NEGATIVE
GLUCOSE UA: NEGATIVE mg/dL
Leukocytes, UA: NEGATIVE
Nitrite, UA: NEGATIVE
Protein Ur, POC: NEGATIVE mg/dL
SPEC GRAV UA: 1.02 (ref 1.010–1.025)
Urobilinogen, UA: 0.2 E.U./dL
pH, UA: 7.5 (ref 5.0–8.0)

## 2017-01-14 MED ORDER — AMLODIPINE BESYLATE 10 MG PO TABS: 10.0000 mg | ORAL_TABLET | Freq: Every day | ORAL | 3 refills | Status: DC

## 2017-01-14 MED ORDER — FINASTERIDE 5 MG PO TABS: 5.0000 mg | ORAL_TABLET | Freq: Every day | ORAL | 3 refills | Status: DC

## 2017-01-14 MED ORDER — VALSARTAN 160 MG PO TABS: 160.0000 mg | ORAL_TABLET | Freq: Every day | ORAL | 3 refills | Status: DC

## 2017-01-14 NOTE — Progress Notes (Signed)
PRIMARY CARE AT [facility] [address] [place] [address] [phone]  Date:  01/14/2017   Name:  Phillip Hicks   DOB:  04-18-1956   MRN:  [id number]  PCP:  Phillip Bachelor, PA    History of Present Illness:  Phillip Hicks is a 61 y.o. male patient who presents to PCP with  Chief Complaint  Patient presents with  . Annual Exam     Diet: no restrictions, subs, and prepared meals at home.  He lives in [place] , but lives in [place].  Soft drinks 2-3.  Water intake is few cups.  about 3 cups of coffee  BM: regular.  constipation normal.  No blood in stool or black stool  Urination: some frequency secondary to prostate.  Finasteride has helped.  No hematuria, dysuria.    Sleeps: 6-7 hours.  No difficulty getting asleep.  He wakes but can get back to slumber.  Social activity: travel to [place] and [place], visiting family.  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   Exercise: occasional walks, and works in yard.  Plans to add exercise into his life with the retirement.     EtOH: 1-2 drinks per day Illicit drug use: none Tobacco or vaping: none   Colonoscopy in 2009 QQV:ZDGLOV medications daily denies side effects. Bph: taken as prescribed.  No se as well.  Urine more controlled on the medication.  May urinate out of habit during the night, then actual sensation or needing to urinate.  Patient Active Problem List   Diagnosis Date Noted  . BPH (benign prostatic hyperplasia) 05/14/2015  . Diverticulitis 05/10/2012  . HTN (hypertension) 03/23/2012    Past Medical History:  Diagnosis Date  . Diverticulitis   . Hypertension     No past surgical history on file.  Social History  Substance Use Topics  . Smoking status: Former Smoker    Quit date: 03/23/1997  . Smokeless tobacco: Never Used  . Alcohol use 4.8 oz/week    8 Standard drinks or equivalent per week    Family History  Problem Relation  Age of Onset  . Asthma Mother   . Cancer Mother   . Hypertension Mother   . Seizures Brother   . Hypertension Sister   . Cancer Sister   . Stroke Sister   . Cancer Maternal Grandmother     No Known Allergies  Medication list has been reviewed and updated.  No current outpatient prescriptions on file prior to visit.   No current facility-administered medications on file prior to visit.     Review of Systems  Constitutional: Negative for chills and fever.  HENT: Negative for ear discharge, ear pain and sore throat.   Eyes: Negative for blurred vision and double vision.  Respiratory: Negative for cough, shortness of breath and wheezing.   Cardiovascular: Negative for chest pain, palpitations and leg swelling.  Gastrointestinal: Negative for diarrhea, nausea and vomiting.  Genitourinary: Negative for dysuria, frequency and hematuria.  Skin: Negative for itching and rash.  Neurological: Negative for dizziness and headaches.   ROS otherwise unremarkable unless listed above.  Physical Examination: BP 125/86   Pulse 79   Temp 98.8 F (37.1 C) (Oral)   Resp 18   Ht _0  (1.778 m)   Wt 230 lb 9.6 oz (104.6 kg)   SpO2 96%   BMI 33.09 kg/m  Ideal Body Weight: Weight in (lb) to have  BMI = 25: 173.9  Physical Exam  Constitutional: He is oriented to person, place, and time. He appears well-developed and well-nourished. No distress.  HENT:  Head: Normocephalic and atraumatic.  Right Ear: Tympanic membrane, external ear and ear canal normal.  Left Ear: Tympanic membrane, external ear and ear canal normal.  Eyes: Pupils are equal, round, and reactive to light. Conjunctivae and EOM are normal.  Cardiovascular: Normal rate and regular rhythm.  Exam reveals no friction rub.   No murmur heard. Pulmonary/Chest: Effort normal. No respiratory distress. He has no wheezes.  Abdominal: Soft. Bowel sounds are normal. He exhibits no distension and no mass. There is no tenderness.   Musculoskeletal: Normal range of motion. He exhibits no edema or tenderness.  Neurological: He is alert and oriented to person, place, and time. He displays normal reflexes.  Skin: Skin is warm and dry. He is not diaphoretic.  Psychiatric: He has a normal mood and affect. His behavior is normal.     Assessment and Plan: [name] is a 61 y.o. male who is here today for cc of annual physical exam Refills given He is to follow up with new pcp once established in [place] Declines  Prostate exam Annual physical exam - Plan: PSA, TSH, Lipid panel, CMP14+EGFR, CBC, valsartan (DIOVAN) 160 MG tablet, amLODipine (NORVASC) 10 MG tablet, finasteride (PROSCAR) 5 MG tablet, Flu Vaccine QUAD 36+ mos IM, POCT urinalysis dipstick  Screening for prostate cancer - Plan: PSA  Screening for thyroid disorder - Plan: TSH  Screening for lipid disorders - Plan: Lipid panel  Screening for metabolic disorder - Plan: CMP14+EGFR  Screening for deficiency anemia - Plan: CBC  Essential hypertension - Plan: valsartan (DIOVAN) 160 MG tablet, amLODipine (NORVASC) 10 MG tablet  Benign prostatic hyperplasia with lower urinary tract symptoms, symptom details unspecified - Plan: finasteride (PROSCAR) 5 MG tablet  Need for prophylactic vaccination and inoculation against influenza - Plan: Flu Vaccine QUAD 36+ mos IM  Screening for hematuria or proteinuria - Plan: POCT urinalysis dipstick  Ivar Drape, PA-C [facility] 8/30/20188:43 AM

## 2017-01-14 NOTE — Patient Instructions (Addendum)
Keeping you healthy  Get these tests  Blood pressure- Have your blood pressure checked once a year by your healthcare provider.  Normal blood pressure is 120/80  Weight- Have your body mass index (BMI) calculated to screen for obesity.  BMI is a measure of body fat based on height and weight. You can also calculate your own BMI at [email or web address].com/bmi/.  Cholesterol- Have your cholesterol checked every year.  Diabetes- Have your blood sugar checked regularly if you have high blood pressure, high cholesterol, have a family history of diabetes or if you are overweight.  Screening for Colon Cancer- Colonoscopy starting at age 50.  Screening may begin sooner depending on your family history and other health conditions. Follow up colonoscopy as directed by your Gastroenterologist.  Screening for Prostate Cancer- Both blood work (PSA) and a rectal exam help screen for Prostate Cancer.  Screening begins at age 40 with African-American men and at age 50 with Caucasian men.  Screening may begin sooner depending on your family history.  Take these medicines  Aspirin- One aspirin daily can help prevent Heart disease and Stroke.  Flu shot- Every fall.  Tetanus- Every 10 years.  Zostavax- Once after the age of 60 to prevent Shingles.  Pneumonia shot- Once after the age of 65; if you are younger than 65, ask your healthcare provider if you need a Pneumonia shot.  Take these steps  Don't smoke- If you do smoke, talk to your doctor about quitting.  For tips on how to quit, go to www.smokefree.gov or call 1-800-QUIT-NOW.  Be physically active- Exercise 5 days a week for at least 30 minutes.  If you are not already physically active start slow and gradually work up to 30 minutes of moderate physical activity.  Examples of moderate activity include walking briskly, mowing the yard, dancing, swimming, bicycling, etc.  Eat a healthy diet- Eat a variety of healthy food such as fruits, vegetables,  low fat milk, low fat cheese, yogurt, lean meant, poultry, fish, beans, tofu, etc. For more information go to www.thenutritionsource.org  Drink alcohol in moderation- Limit alcohol intake to less than two drinks a day. Never drink and drive.  Dentist- Brush and floss twice daily; visit your dentist twice a year.  Depression- Your emotional health is as important as your physical health. If you're feeling down, or losing interest in things you would normally enjoy please talk to your healthcare provider.  Eye exam- Visit your eye doctor every year.  Safe sex- If you may be exposed to a sexually transmitted infection, use a condom.  Seat belts- Seat belts can save your life; always wear one.  Smoke/Carbon Monoxide detectors- These detectors need to be installed on the appropriate level of your home.  Replace batteries at least once a year.  Skin cancer- When out in the sun, cover up and use sunscreen 15 SPF or higher.  Violence- If anyone is threatening you, please tell your healthcare provider.  Living Will/ Health care power of attorney- Speak with your healthcare provider and family.   IF you received an x-ray today, you will receive an invoice from [facility]. Please contact  Radiology at [phone] with questions or concerns regarding your invoice.   IF you received labwork today, you will receive an invoice from [facility]. Please contact [facility] at [phone] with questions or concerns regarding your invoice.   Our billing staff will not be able to assist you with questions regarding bills from these companies.  You will be contacted   with the lab results as soon as they are available. The fastest way to get your results is to activate your My Chart account. Instructions are located on the last page of this paperwork. If you have not heard from us regarding the results in 2 weeks, please contact this office.     

## 2017-01-15 LAB — CMP14+EGFR
A/G RATIO: 1.8 (ref 1.2–2.2)
ALT: 25 IU/L (ref 0–44)
AST: 23 IU/L (ref 0–40)
Albumin: 4.7 g/dL (ref 3.5–5.5)
Alkaline Phosphatase: 61 IU/L (ref 39–117)
BILIRUBIN TOTAL: 0.7 mg/dL (ref 0.0–1.2)
BUN/Creatinine Ratio: 13 (ref 9–20)
BUN: 10 mg/dL (ref 6–24)
CALCIUM: 9.5 mg/dL (ref 8.7–10.2)
CHLORIDE: 105 mmol/L (ref 96–106)
CO2: 22 mmol/L (ref 20–29)
Creatinine, Ser: 0.8 mg/dL (ref 0.76–1.27)
GFR calc Af Amer: 113 mL/min/{1.73_m2} (ref >59)
GFR, EST NON AFRICAN AMERICAN: 98 mL/min/{1.73_m2} (ref >59)
GLOBULIN, TOTAL: 2.6 g/dL (ref 1.5–4.5)
Glucose: 97 mg/dL (ref 65–99)
POTASSIUM: 4.1 mmol/L (ref 3.5–5.2)
SODIUM: 141 mmol/L (ref 134–144)
Total Protein: 7.3 g/dL (ref 6.0–8.5)

## 2017-01-15 LAB — CBC
Hematocrit: 43.6 % (ref 37.5–51.0)
Hemoglobin: 14.6 g/dL (ref 13.0–17.7)
MCH: 28.6 pg (ref 26.6–33.0)
MCHC: 33.5 g/dL (ref 31.5–35.7)
MCV: 85 fL (ref 79–97)
PLATELETS: 306 10*3/uL (ref 150–379)
RBC: 5.11 x10E6/uL (ref 4.14–5.80)
RDW: 15.2 % (ref 12.3–15.4)
WBC: 4.9 10*3/uL (ref 3.4–10.8)

## 2017-01-15 LAB — TSH: TSH: 1.41 u[IU]/mL (ref 0.450–4.500)

## 2017-01-15 LAB — PSA: Prostate Specific Ag, Serum: 0.1 ng/mL (ref 0.0–4.0)

## 2017-01-15 LAB — LIPID PANEL
CHOL/HDL RATIO: 2.4 ratio (ref 0.0–5.0)
CHOLESTEROL TOTAL: 159 mg/dL (ref 100–199)
HDL: 65 mg/dL (ref >39)
LDL CALC: 78 mg/dL (ref 0–99)
TRIGLYCERIDES: 81 mg/dL (ref 0–149)
VLDL CHOLESTEROL CAL: 16 mg/dL (ref 5–40)

## 2017-07-28 ENCOUNTER — Telehealth: Payer: Self-pay | Admitting: Physician Assistant

## 2017-07-28 DIAGNOSIS — Z0000 Encounter for general adult medical examination without abnormal findings: Secondary | ICD-10-CM

## 2017-07-28 DIAGNOSIS — I10 Essential (primary) hypertension: Secondary | ICD-10-CM

## 2017-07-28 DIAGNOSIS — N401 Enlarged prostate with lower urinary tract symptoms: Secondary | ICD-10-CM

## 2017-07-28 MED ORDER — FINASTERIDE 5 MG PO TABS: 5.0000 mg | ORAL_TABLET | Freq: Every day | ORAL | 1 refills | Status: AC

## 2017-07-28 MED ORDER — AMLODIPINE BESYLATE 10 MG PO TABS: 10.0000 mg | ORAL_TABLET | Freq: Every day | ORAL | 1 refills | Status: AC

## 2017-07-28 MED ORDER — VALSARTAN 160 MG PO TABS: 160.0000 mg | ORAL_TABLET | Freq: Every day | ORAL | 1 refills | Status: AC

## 2017-07-28 NOTE — Telephone Encounter (Signed)
Copied from CRM [id number]. Topic: Quick Communication - Rx Refill/Question >> Jul 09, 2017  2:18 PM Lelon Frohlich, [place] wrote: Medication: amlodipine 10 mg ,finasteride 5 mg and valsartan 160 mg   Has the patient contacted their pharmacy? yes   (Agent: If no, request that the patient contact the pharmacy for the refill.)   Preferred Pharmacy (with phone number or street name): [facility]   Agent: Please be advised that RX refills may take up to 3 business days. We ask that you follow-up with your pharmacy. >> Jul 28, 2017  4:17 PM Raquel Sarna wrote: amlodipine 10 mg ,finasteride 5 mg and valsartan 160 mg  Pt has changed insurance w/ BCBS Needing the Rx's requested refilled. Please resend the refills to [facility] pharmacy w/ pt new insurance.   [facility] #[id number] [place], [place] - [address] AT [facility] [address] [place] [address]  [address] [place] [address]  Phone: [phone] Fax: [phone]

## 2017-08-19 ENCOUNTER — Encounter: Payer: Self-pay | Admitting: Physician Assistant
